# Patient Record
Sex: Male | Born: 1955 | Hispanic: Yes | Marital: Single | Smoking: Former smoker
Health system: Southern US, Community
[De-identification: ages and names within clinical notes are randomized; demographics above are authoritative.]

## PROBLEM LIST (undated history)

## (undated) DIAGNOSIS — K429 Umbilical hernia without obstruction or gangrene: Secondary | ICD-10-CM

## (undated) DIAGNOSIS — R32 Unspecified urinary incontinence: Secondary | ICD-10-CM

## (undated) DIAGNOSIS — F32A Depression, unspecified: Secondary | ICD-10-CM

## (undated) DIAGNOSIS — E785 Hyperlipidemia, unspecified: Secondary | ICD-10-CM

## (undated) DIAGNOSIS — I10 Essential (primary) hypertension: Secondary | ICD-10-CM

## (undated) DIAGNOSIS — S68012A Complete traumatic metacarpophalangeal amputation of left thumb, initial encounter: Secondary | ICD-10-CM

## (undated) DIAGNOSIS — F419 Anxiety disorder, unspecified: Secondary | ICD-10-CM

## (undated) DIAGNOSIS — N529 Male erectile dysfunction, unspecified: Secondary | ICD-10-CM

## (undated) DIAGNOSIS — F329 Major depressive disorder, single episode, unspecified: Secondary | ICD-10-CM

## (undated) DIAGNOSIS — E079 Disorder of thyroid, unspecified: Secondary | ICD-10-CM

## (undated) DIAGNOSIS — K4091 Unilateral inguinal hernia, without obstruction or gangrene, recurrent: Secondary | ICD-10-CM

## (undated) HISTORY — DX: Hyperlipidemia, unspecified: E78.5

## (undated) HISTORY — DX: Anxiety disorder, unspecified: F41.9

## (undated) HISTORY — DX: Complete traumatic metacarpophalangeal amputation of left thumb, initial encounter: S68.012A

## (undated) HISTORY — DX: Unilateral inguinal hernia, without obstruction or gangrene, recurrent: K40.91

## (undated) HISTORY — DX: Depression, unspecified: F32.A

## (undated) HISTORY — DX: Disorder of thyroid, unspecified: E07.9

## (undated) HISTORY — DX: Male erectile dysfunction, unspecified: N52.9

## (undated) HISTORY — DX: Major depressive disorder, single episode, unspecified: F32.9

## (undated) HISTORY — DX: Umbilical hernia without obstruction or gangrene: K42.9

## (undated) HISTORY — DX: Essential (primary) hypertension: I10

---

## 1988-10-10 HISTORY — PX: HERNIA REPAIR: SHX51

## 2011-02-22 ENCOUNTER — Inpatient Hospital Stay (HOSPITAL_COMMUNITY)
Admission: EM | Admit: 2011-02-22 | Discharge: 2011-02-25 | DRG: 645 | Disposition: A | Discharge status: Home / Self Care | Payer: Self-pay | Attending: Internal Medicine | Admitting: Internal Medicine

## 2011-02-22 ENCOUNTER — Emergency Department (HOSPITAL_COMMUNITY): Payer: Self-pay

## 2011-02-22 ENCOUNTER — Encounter (HOSPITAL_COMMUNITY): Payer: Self-pay

## 2011-02-22 DIAGNOSIS — R002 Palpitations: Secondary | ICD-10-CM | POA: Diagnosis present

## 2011-02-22 DIAGNOSIS — E0500 Thyrotoxicosis with diffuse goiter without thyrotoxic crisis or storm: Principal | ICD-10-CM | POA: Diagnosis present

## 2011-02-22 DIAGNOSIS — R5381 Other malaise: Secondary | ICD-10-CM | POA: Diagnosis present

## 2011-02-22 DIAGNOSIS — R7989 Other specified abnormal findings of blood chemistry: Secondary | ICD-10-CM | POA: Diagnosis present

## 2011-02-22 DIAGNOSIS — R634 Abnormal weight loss: Secondary | ICD-10-CM | POA: Diagnosis present

## 2011-02-22 DIAGNOSIS — E876 Hypokalemia: Secondary | ICD-10-CM | POA: Diagnosis present

## 2011-02-22 LAB — BASIC METABOLIC PANEL
BUN: 16 mg/dL (ref 6–23)
BUN: 14 mg/dL (ref 6–23)
Chloride: 107 mEq/L (ref 96–112)
Creatinine, Ser: <0.47 mg/dL (ref 0.4–1.5)
Creatinine, Ser: 0.5 mg/dL (ref 0.4–1.5)
GFR calc non Af Amer: >60 mL/min (ref >60)
Glucose, Bld: 131 mg/dL — ABNORMAL HIGH (ref 70–99)
Glucose, Bld: 182 mg/dL — ABNORMAL HIGH (ref 70–99)
Potassium: 3.4 mEq/L — ABNORMAL LOW (ref 3.5–5.1)

## 2011-02-22 LAB — COMPREHENSIVE METABOLIC PANEL
ALT: 22 U/L (ref 0–53)
AST: 16 U/L (ref 0–37)
Albumin: 3.7 g/dL (ref 3.5–5.2)
Alkaline Phosphatase: 123 U/L — ABNORMAL HIGH (ref 39–117)
Chloride: 104 mEq/L (ref 96–112)
Potassium: <2 mEq/L — CL (ref 3.5–5.1)
Sodium: 140 mEq/L (ref 135–145)
Total Bilirubin: 0.4 mg/dL (ref 0.3–1.2)
Total Protein: 6.9 g/dL (ref 6.0–8.3)

## 2011-02-22 LAB — MRSA PCR SCREENING: MRSA by PCR: NEGATIVE

## 2011-02-22 LAB — TSH: TSH: <0.008 u[IU]/mL — ABNORMAL LOW (ref 0.350–4.500)

## 2011-02-22 LAB — RAPID URINE DRUG SCREEN, HOSP PERFORMED
Amphetamines: NOT DETECTED
Benzodiazepines: NOT DETECTED
Cocaine: NOT DETECTED

## 2011-02-22 LAB — POTASSIUM: Potassium: 4 mEq/L (ref 3.5–5.1)

## 2011-02-23 LAB — BASIC METABOLIC PANEL
BUN: 10 mg/dL (ref 6–23)
CO2: 23 mEq/L (ref 19–32)
Calcium: 9.3 mg/dL (ref 8.4–10.5)
Chloride: 109 mEq/L (ref 96–112)
Creatinine, Ser: <0.47 mg/dL (ref 0.4–1.5)

## 2011-02-23 LAB — CARDIAC PANEL(CRET KIN+CKTOT+MB+TROPI)
CK, MB: 1.2 ng/mL (ref 0.3–4.0)
Relative Index: INVALID (ref 0.0–2.5)
Relative Index: INVALID (ref 0.0–2.5)
Troponin I: <0.3 ng/mL (ref <0.30)

## 2011-02-23 LAB — T4, FREE: Free T4: 6.1 ng/dL — ABNORMAL HIGH (ref 0.80–1.80)

## 2011-02-24 LAB — DIFFERENTIAL
Basophils Relative: 0 % (ref 0–1)
Eosinophils Relative: 2 % (ref 0–5)
Lymphocytes Relative: 29 % (ref 12–46)
Monocytes Absolute: 1.1 10*3/uL — ABNORMAL HIGH (ref 0.1–1.0)
Monocytes Relative: 14 % — ABNORMAL HIGH (ref 3–12)
Neutrophils Relative %: 55 % (ref 43–77)

## 2011-02-24 LAB — CBC
HCT: 39.5 % (ref 39.0–52.0)
MCV: 79.2 fL (ref 78.0–100.0)
RBC: 4.99 MIL/uL (ref 4.22–5.81)
RDW: 12.4 % (ref 11.5–15.5)
WBC: 7.5 10*3/uL (ref 4.0–10.5)

## 2011-02-24 LAB — T3, FREE: T3, Free: 15.8 pg/mL — ABNORMAL HIGH (ref 2.3–4.2)

## 2011-02-24 LAB — HEMOGLOBIN A1C
Hgb A1c MFr Bld: 5 % (ref <5.7)
Mean Plasma Glucose: 97 mg/dL (ref <117)

## 2011-02-24 LAB — NA AND K (SODIUM & POTASSIUM), RAND UR: Potassium Urine: 15 mEq/L

## 2011-02-24 LAB — BASIC METABOLIC PANEL
BUN: 13 mg/dL (ref 6–23)
CO2: 27 mEq/L (ref 19–32)
Chloride: 103 mEq/L (ref 96–112)
Creatinine, Ser: <0.47 mg/dL (ref 0.4–1.5)
Glucose, Bld: 99 mg/dL (ref 70–99)
Potassium: 4.3 mEq/L (ref 3.5–5.1)

## 2011-02-24 LAB — HEPATIC FUNCTION PANEL
Albumin: 3.6 g/dL (ref 3.5–5.2)
Indirect Bilirubin: 0.5 mg/dL (ref 0.3–0.9)
Total Protein: 6.3 g/dL (ref 6.0–8.3)

## 2011-02-24 LAB — TSH: TSH: <0.008 u[IU]/mL — ABNORMAL LOW (ref 0.350–4.500)

## 2011-02-24 LAB — CARDIAC PANEL(CRET KIN+CKTOT+MB+TROPI): Troponin I: <0.3 ng/mL (ref <0.30)

## 2011-02-24 LAB — T4, FREE: Free T4: 5.53 ng/dL — ABNORMAL HIGH (ref 0.80–1.80)

## 2011-02-25 LAB — CBC
HCT: 42 % (ref 39.0–52.0)
Hemoglobin: 15.4 g/dL (ref 13.0–17.0)
MCH: 29.2 pg (ref 26.0–34.0)
RBC: 5.28 MIL/uL (ref 4.22–5.81)

## 2011-02-25 LAB — RENAL FUNCTION PANEL
CO2: 26 mEq/L (ref 19–32)
Chloride: 100 mEq/L (ref 96–112)
Creatinine, Ser: <0.47 mg/dL (ref 0.4–1.5)
Glucose, Bld: 96 mg/dL (ref 70–99)
Sodium: 135 mEq/L (ref 135–145)

## 2011-02-25 LAB — BASIC METABOLIC PANEL
CO2: 26 mEq/L (ref 19–32)
Chloride: 100 mEq/L (ref 96–112)
Potassium: 4.2 mEq/L (ref 3.5–5.1)

## 2011-02-25 NOTE — Consult Note (Signed)
[name], [name]             ACCOUNT NO.:  [id number]  MEDICAL RECORD NO.:  [id number]           PATIENT TYPE:  I  LOCATION:  1442                         FACILITY:  [facility]  PHYSICIAN:  Tonita Cong, M.D.  DATE OF BIRTH:  03-14-1956  DATE OF CONSULTATION:  02/24/2011 DATE OF DISCHARGE:                                CONSULTATION   Consult requested by [facility].  REASON FOR CONSULTATION:  Hyperthyroidism.  HISTORY OF PRESENT ILLNESS:  [name] describes 10 months of unintentional weight loss.  He has lost from 190 pounds down to 150 pounds over those 10 months.  Associated tremors.  No clear precipitating or contributing factor.  On multiple occasions, he has experienced weakness in his legs upon awakening in the morning.  He describes hypokalemia found during an episode of lower extremity paralysis.  No previous diagnosis of a thyroid disorder.  He does not use thyroid hormone by prescription or supplement.  No recent iodinated contrast exposure.  PAST MEDICAL HISTORY: 1. Hypertension. 2. Hypokalemia.  ALLERGIES:  No known drug allergies.  FAMILY MEDICAL HISTORY:  No family history of thyroid disorders.  SOCIAL HISTORY:  Moved to [place] 5 days ago and has spent 3 days in the hospital.  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  No tobacco use.  MEDICATIONS:  Outpatient medications and inpatient medications reviewed including the pill bottles from the outpatient medicines and supplements.  REVIEW OF SYSTEMS:  No weight gain.  He describes palpitations with a rapid heartbeat at times.  He has intermittent diarrhea but no vomiting. No heat intolerance or cold intolerance.  No fever.  PHYSICAL EXAMINATION:  VITAL SIGNS:  Temperature 98.6, pulse 79, respirations 18, blood pressure 122/76, oxygen saturation 97% on room air. GENERAL:  No apparent distress.  Lying quietly  and comfortably in the hospital bed.  Spanish language interpreter present for the entire visit. HEENT:  Head, ears, nose and throat unremarkable. NECK:  Reveals mild symmetric enlargement of thyroid without audible bruit, nontender, mobile without fixation. LYMPHS:  No cervical adenopathy. CARDIOVASCULAR:  Regular rhythm and rate.  No audible murmur.  Radial pulses full and symmetric. SKIN:  Appropriate warmth.  No visible rash. NEUROLOGIC:  Deep tendon reflexes 1 to 2+ and symmetric.  Minimal tremor in hands.  No appreciable focal deficits. MUSCULOSKELETAL:  Normal muscle strength, no atrophy. GASTROINTESTINAL:  Active bowel sounds.  Normal pitched bowel sounds. Soft, nontender, nondistended abdomen.  No appreciable organomegaly or masses. RESPIRATORY:  Clear, symmetric, unlabored.  MENTAL STATUS EXAMINATION:  Alert, conversant.  No apparent hallucinations or delusions.  Insight and judgment appropriate. Appropriate affect and mood.  LABORATORY DATA:  From Feb 22, 2011; sodium 140, potassium less than 2.0, chloride 104, bicarbonate 22, BUN 16, creatinine less than 0.47, glucose 219, alkaline phosphatase 123, ALT 22, AST 16, calcium 9.1, albumin 3.7, magnesium 1.8.  On Feb 24, 2011, sodium 148, potassium 4.3, TSH less than 0.008 mU/mL, free T4 5.53 ng/dL with reference range 0.8 through 1.8, free T3 15.8 mcg/mL with reference range 2.3 through 4.2,  hemoglobin A1c 5.0%.  CT scan of head without contrast obtained upon admission without significant intracranial abnormality.  ASSESSMENT: 1. Hyperthyroidism. 2. Hypokalemia.  His duration of symptoms suggest either Graves' disease or toxic nodules. The exam favors Graves' disease.  A thyroiditis seems less likely since his symptoms have persisted more than 3 months.  The hypokalemia may be related to the hyperthyroidism since thyrotoxicosis increases sodium-potassium  ATPase activity which leads to increased intracellular potassium  concentrations.  This can cause what is known as thyrotoxic periodic paralysis.  It may happen particularly in a man who awakens in the morning with flaccid ascending paralysis as a result of untreated hyperthyroidism and typically there is history of vigorous exercise and/or large high carbohydrate meal before retiring.  In this gentleman's case, recommend starting antithyroid drug therapy with methimazole.  Also obtain CBC with differential to monitor blood counts before and during therapy.  Start beta-blocker therapy and may continue oral potassium supplement if needed.  Currently receiving intravenous normal saline without glucose.  No intravenous potassium at this time which is appropriate in this setting.  Recommend obtaining  thyroid stimulating immunoglobulin as an antibody test which is often positive in the setting of Graves' disease.  If the laboratory test does not reveal the underlying cause, then consider thyroid uptake and scan. Methimazole will need to be held for 7 days before the thyroid uptake scan, if needed.    Informed consent obtained after discussion of anticipated benefits, alternatives, nature of therapy, and risks including but not limited to rash, allergic reaction, bone marrow suppression, liver damage, etc. from methimazole.  Instructed the gentleman to stop taking methimazole and contact us if fever, rash, or sore throat develop.  I spoke with the hospitalist team.  Atenolol will be started as the beta-blocker therapy.  At this time recommend starting methimazole 60 mg by mouth daily.  Recommend a followup visit including a lab visit in 1 week and office visit in 3 to 4 weeks at [facility] with me.  We are located at [facility], [address], [address], [place], [place] [address], phone number [phone].  In one week, plan to obtain another CBC with differential and hepatic panel.  In 3 to 4 weeks at the  office visit we will check thyroid function test again.  Once the hyperthyroidism is improved, then consider more definitive therapy such as radioactive iodine or even thyroid surgery if indicated.  Education provided including a patient handout from the American [employer] Association on hyperthyroidism written in Spanish.     Tonita Cong, M.D.     JSK/MEDQ  D:  02/24/2011  T:  02/24/2011  Job:  [id number]  Electronically Signed by Talmage Coin M.D. on 02/25/2011 07:17:24 AM

## 2011-02-26 NOTE — H&P (Signed)
[name], VOLD             ACCOUNT NO.:  [id number]  MEDICAL RECORD NO.:  [id number]           PATIENT TYPE:  E  LOCATION:  WLED                         FACILITY:  [facility]  PHYSICIAN:  Houston Siren, MD           DATE OF BIRTH:  1956-01-28  DATE OF ADMISSION:  02/22/2011 DATE OF DISCHARGE:                             HISTORY & PHYSICAL   PRIMARY CARE PHYSICIAN:  None.  ADVANCE DIRECTIVE:  Full code.  REASON FOR ADMISSION:  Profound hypokalemia.  HISTORY OF PRESENT ILLNESS:  This is a 55 year old Hispanic male with a history of chronic hypokalemia and arthritis who presents to the emergency room feeling profoundly weak.  He stated his lower extremities were giving out on him. they were weak, that he actually fell on the ground. He also felt that his lower extremities were stiff.  There has been mild bilateral paresthesia and mild headache.  He denied any loose stool or diarrhea.  He has not been using any diuretic.  Reportedly, his friend gave him supplements of potassium, but it has not helped his symptoms. He stated he had hypokalemia before and was treated, but there is no old record from his E chart.  Emergency room evaluation showed that his potassium was below 2, and it was repeated and confirmed.  He does have normal serum sodium and normal creatinine.  His original EKG shows intraventricular conduction delay with U waves and deep S waves in the precordial leads.  He was originally given 40 mEq of potassium by mouth and one run of 10 mEq of potassium.  A repeat EKG showed sinus rhythm with no acute ST-T changes.  Hospitalist was asked to admit the patient because of profound hypokalemia and for further workup.  His symptoms actually improved after 1 run of potassium IV.  PAST MEDICAL HISTORY: 1. History of hypokalemia. 2. Arthritis.  SOCIAL HISTORY:  He denied tobacco, alcohol, or drug use.  FAMILY HISTORY:  Noncontributory.  MEDICATIONS:  Naproxen muscle relaxer  and potassium gluconate over-the- counter.  REVIEW OF SYSTEMS:  Otherwise unremarkable.  PHYSICAL EXAMINATION:  VITAL SIGNS:  Blood pressure 150/72, pulse of 108, respiratory rate of 20, temperature 98.0. HEENT:  His extraocular muscles are intact.  Sclerae are nonicteric.  He has no thyromegaly. CARDIAC:  Revealed S1 and S2 regular.  I did not hear any murmur, rub, or gallop. LUNGS:  Clear. ABDOMEN:  Soft, nondistended, nontender.  I did not hear any abdominal bruit. EXTREMITIES:  No edema.  Strength equal bilaterally.  Good distal pulses. SKIN:  Warm and dry.  OBJECTIVE FINDINGS:  EKG as noted above.  Serum sodium 140, potassium less than 2, bicarb of 22, creatinine of 0.47, BUN of 16.  Liver function tests are normal.  Alcohol level less than 11.  CT scan of the head is negative except for chronic sinusitis, magnesium of 1.8, calcium of 9.1.  Urine drug screen is negative.  IMPRESSION:  This is a 55 year old with profound hypokalemia with normal magnesium and normal calcium.  I am sure that his symptom is from this hypokalemia.  The differential here is great,  and that it would include adrenal producing tumor like an aldosterone secreting tumor, rule out hereditary hypokalemia from Bartter and Gitelman syndrome.  He could also have periodic hypokalemic paralysis, which can be dangerous. Thyroid problems can cause hypokalemia as well. He will need a complete workup, and nephrology consult would be very helpful.  For now, I would admit him to the step-down and replete his  potassium as his total body potassium deficit is very large.  I am glad that his serum calcium is normal, but we will need to follow that closely as well.  He is a full code, will be admitted to [facility] Citizen, MD     PL/MEDQ  D:  02/22/2011  T:  02/22/2011  Job:  [id number]  Electronically Signed by Houston Siren  on 02/26/2011 11:31:32 PM

## 2011-02-28 LAB — THYROID STIMULATING IMMUNOGLOBULIN: TSI: 336 % baseline — ABNORMAL HIGH (ref <140)

## 2011-03-03 LAB — ALDOSTERONE + RENIN ACTIVITY W/ RATIO
ALDO / PRA Ratio: 3.7 Ratio (ref 0.9–28.9)
Aldosterone: 9 ng/dL

## 2011-03-10 NOTE — Consult Note (Signed)
[name], Atkins             ACCOUNT NO.:  [id number]  MEDICAL RECORD NO.:  [id number]           PATIENT TYPE:  I  LOCATION:  1442                         FACILITY:  [facility]  PHYSICIAN:  Terrial Rhodes, M.D.DATE OF BIRTH:  [birth year]  DATE OF CONSULTATION: DATE OF DISCHARGE:                                CONSULTATION   CONSULTING PHYSICIAN:  Marcellus Scott, MD  REASON FOR CONSULTATION:  Recurrent hypokalemia with weakness.  HISTORY OF PRESENT ILLNESS:  [name] is a 55 year old Hispanic male with a past medical history significant for arthritis and recurrent hypokalemia with weakness.  He presented to the emergency department of [facility] with feeling profoundly weak as well as cramping and reported that he felt like this in the past and was seen at another hospital about a year ago with similar symptoms and was found to have low potassium.  While in the emergency department, his serum potassium was less than 2.  He was admitted for IV potassium replacement.  His EKG showed hypokalemic changes with U waves, and he has continued to feel better since his admission.  However, due to recurring nature of this problem, we were consulted to further evaluate and manage.  ALLERGIES:  He has no known drug allergies.  PAST MEDICAL HISTORY: 1. History of hypertension 2 years ago, was on a blood pressure     medicine but has not been taking anything. 2. History of recurrent hypokalemia with weakness. 3. Arthritis.  OUTPATIENT MEDICATIONS:  He was taking over-the-counter potassium, Naprosyn and a muscle relaxer.  FAMILY HISTORY:  Noncontributory.  No one else in his family with this problem.  SOCIAL HISTORY:  Denied tobacco, alcohol or drug use.  REVIEW OF SYSTEMS:  GENERAL:  Denies any anorexia or malaise but just had the weakness prior to admission.  CARDIAC:  He reported some palpitations and chest pain prior to admission.  HEENT:  No headaches, tinnitus,  dysphagia, odynophagia.  PULMONARY:  No shortness of breath, hemoptysis, productive cough.  He did have 1 episode of dyspnea on exertion.  GI:  No nausea or vomiting but did have a bout of diarrhea about a month ago that lasted for 2 weeks.  He reports some abdominal bloating occasionally but no anorexia or weight loss.  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  No polydipsia, polyphagia, polyuria.  RHEUMATOLOGIC:  He has chronic arthralgias and myalgias as well as the cramping that was described above.  NEUROLOGIC:  Had generalized weakness.  No numbness, tingling, ataxia, aphasia.  All other systems negative.  PHYSICAL EXAMINATION:  GENERAL:  A well-developed, well-nourished man in no apparent distress, lying in bed. Temperature is 98, pulse is 74, blood pressure 112/72, respiratory rate is 20. HEENT:  No proptosis.  Oropharynx without lesions. NECK:  Supple.  No lymphadenopathy or bruits.  No thyromegaly. LUNGS:  Clear to auscultation and percussion bilaterally.  No rales or rhonchi. CARDIAC:  Regular rate and rhythm.  No precordial rub appreciated. ABDOMEN:  Normoactive bowel sounds.  Soft, nontender, nondistended.  No guarding, rebound or bruits. EXTREMITIES:  No clubbing, cyanosis or edema. NEUROLOGIC:  Grossly intact.  No hyperreflexia.  Negative Chvostek sign.  LABORATORY DATA:  Sodium 140, potassium 4.3, chloride 103, CO2 27, BUN 13, creatinine 0.47, glucose 99, calcium 10.3.  TSH is less than 0.008. T3 is 15.8.  T4 is 5.53.  ASSESSMENT AND PLAN: 1. Recurrent hypokalemia with weakness.  The patient was hypertensive     on admission but since admission his blood pressures have been in     the 110s.  He also had a metabolic acidosis on admission, so     Bartter's and Gitelman's are not likely.  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  Also in the differential would be hyperaldosteronism     given his hypertension but again he has been normotensive since     admission.  He had a low urine potassium with metabolic acidosis     that would suggest GI losses.  He did have some diarrhea prior to     admission and this has since resolved, so this is also in the     differential, but we will continue to follow for now.  We will also     decrease his potassium to 40 mEq a day.  His potassium has been     climbing and has been maintaining within normal limits. 2. Hyperthyroidism.  We will need workup and possibly propranolol.     Agree with Endocrine consult. 3. Chest pain and palpitations could possibly be due to thyrotoxicosis     but also coronary artery disease workup per primary service. 4. Hypertension.  He has a history of hypertension in the past but has     not been taking medicines.  He has been normotensive since he has     been hospitalized with occasional fluctuations in the 130s.  We     will continue to follow for now but blood pressure medicine of     shortness in this instance would be propranolol.  Again, we will     wait for Endocrine to further evaluate the patient, and we will     continue to follow.  We will order Renin aldosterone levels in the     morning.  Thank you for this consultation.          ______________________________ Terrial Rhodes, M.D.     JC/MEDQ  D:  02/24/2011  T:  02/24/2011  Job:  [id number]  Electronically Signed by Terrial Rhodes M.D. on 03/10/2011 07:43:40 AM

## 2011-03-17 ENCOUNTER — Emergency Department (HOSPITAL_COMMUNITY)
Admission: EM | Admit: 2011-03-17 | Discharge: 2011-03-18 | Disposition: A | Discharge status: Home / Self Care | Payer: Self-pay | Attending: Emergency Medicine | Admitting: Emergency Medicine

## 2011-03-17 ENCOUNTER — Emergency Department (HOSPITAL_COMMUNITY)
Admission: EM | Admit: 2011-03-17 | Discharge: 2011-03-17 | Discharge status: Against Medical Advice | Payer: Self-pay | Attending: Emergency Medicine | Admitting: Emergency Medicine

## 2011-03-17 DIAGNOSIS — Z760 Encounter for issue of repeat prescription: Secondary | ICD-10-CM | POA: Insufficient documentation

## 2011-03-17 DIAGNOSIS — E876 Hypokalemia: Secondary | ICD-10-CM | POA: Insufficient documentation

## 2011-03-18 LAB — POCT I-STAT, CHEM 8
HCT: 42 % (ref 39.0–52.0)
Hemoglobin: 14.3 g/dL (ref 13.0–17.0)
Sodium: 140 mEq/L (ref 135–145)
TCO2: 23 mmol/L (ref 0–100)

## 2011-04-01 NOTE — Discharge Summary (Signed)
[name], [name]             ACCOUNT NO.:  [id number]  MEDICAL RECORD NO.:  [id number]           PATIENT TYPE:  I  LOCATION:  1442                         FACILITY:  [facility]  PHYSICIAN:  Marcellus Scott, MD     DATE OF BIRTH:  Jun 08, 1956  DATE OF ADMISSION:  02/22/2011 DATE OF DISCHARGE:  02/25/2011                              DISCHARGE SUMMARY   PRIMARY CARE PHYSICIAN:  Mr. Hardie Shackleton primary care physician is Dr. Lerry Liner.  ENDOCRINOLOGIST:  Tonita Cong, M.D with [facility] endocrinology.  DISCHARGE DIAGNOSES: 1. Hyperthyroidism, possibly secondary to Graves' disease. 2. Hypokalemia secondary to hyperthyroidism. 3. Weight loss secondary to hyperthyroidism. 4. Palpitations secondary to hyperthyroidism. 5. Elevated LFTs secondary to hyperthyroidism.  CONDITION ON DISCHARGE:  [name] is alert and oriented.  He appears energetic, feels well, complains of a mild sore throat and mild lower abdominal pain, but is ready for discharge in good condition.  HISTORY AND BRIEF HOSPITAL COURSE:  [name] is an extremely pleasant 55 year old Hispanic gentleman, who speaks very little [place]. He presented to the Emergency Department feeling profoundly weak.  His lower extremities were giving out on him and he had actually fallen to the ground.  He felt as if his lower extremities were stiff.  There had been mild bilateral paresthesia and mild headache.  In the Emergency Department, he was found to have hypokalemia.  The patient reported that this had happened to him in the past and that over the past 10 months, he had lost approximately 40 pounds.  His original EKG showed intraventricular conduction delay with U waves deep S wave in the precordial lead.  A repeat EKG showed sinus rhythm with no acute ST changes.  He was admitted for profound hypokalemia.  On Feb 24, 2011, he was seen in consultation by Dr. Terrial Rhodes with the renal service, who felt that the  patient likely has hyperthyroidism and would need an endocrine consult.  On the same date, Feb 24, 2011, he was seen by Dr. Talmage Coin of endocrinology.  Dr. Daune Perch impression was: 1. Hyperthyroidism. 2. Hypokalemia.  Dr. Sharl Ma notes that the duration of the patient's symptoms suggested a Graves disease or toxic nodule.  Exam favors Graves disease. Hypokalemia may be related to hyperthyroidism and can cause what is known as thyrotoxic periodic paralysis.  Dr. Sharl Ma recommended starting antithyroid drugs therapy with methimazole as well as beta-blocker therapy and continuation of oral potassium supplement.  Dr. Sharl Ma notes that the informed consent was obtained after discussion of anticipated benefits and alternatives and the nature of the therapy of the methimazole.  The patient was instructed to stop taking methimazole if he develops fever, rash or sore throat  Dr. Sharl Ma recommended followup visits including a lab visit on Mar 03, 2011 and March 18, 2011 and an office visit in 3-4 weeks at [facility].  Labs visits were to draw CBC, BMET and hepatic panel.  PHYSICAL EXAMINATION:  GENERAL:  Today, Feb 25, 2011, the patient is alert and oriented and appears ready for discharge. VITAL SIGNS:  Were as follows:  Temperature 97.5, pulse 59, respirations 18, blood pressure 118/73,  O2 saturation is 99% on room air. HEAD:  Atraumatic, normocephalic. EYES:  Anicteric with pupils are equal, round and reactive to light. NOSE:  Shows no nasal discharge or exterior lesions. MOUTH:  Has moist mucous membranes with good dentition. NECK:  Supple with midline trachea.  No JVD.  No lymphadenopathy.  He does have mild bilateral thyromegaly. CHEST:  Clear to auscultation bilaterally without wheezes or crackles. HEART:  Has regular rate and rhythm without obvious murmurs, rubs or gallops. ABDOMEN:  Soft, nontender, nondistended with good bowel sounds. EXTREMITIES:  Show no clubbing, cyanosis or  edema. NEUROLOGIC:  The patient is alert and oriented, walking around the room. He appears healthy.  CONSULTATIONS: 1. Terrial Rhodes, M.D. saw the patient on Feb 24, 2011. 2. Tonita Cong, M.D. saw the patient Feb 24, 2011.  PERTINENT LABORATORY DATA:  On admission, the patient was found to have a TSH of 0.008.  On Feb 24, 2011, he had a free T4 of 5.53.  TSH 0.008 and free T3 15.8.  Urine creatinine 14.2.  Urine sodium 122, urine potassium 15.  His potassium, which was less than two on admission was repleted.  He currently has a potassium on discharge of 4.2, sodium 134, chloride 100, bicarb 26, glucose 92, BUN 16, creatinine 0.47, serum albumin 3.7, serum calcium 10.3.  Serum phosphorus 4.9.  Hemoglobin 15.1, white count 8.6, hematocrit 42.0, platelets 198,000.  SIGNIFICANT IMAGING:  The patient had a CT of his head without contrast on Feb 22, 2011 that showed no significant abnormality of the brain.  He does have chronic sinusitis.  Patient's EKG report from Feb 24, 2011 shows normal sinus rhythm, right atrial enlargement.  His telemetry today has been reading normal sinus rhythm with pulses in the 60s to80s.  DISCHARGE MEDICATIONS:  Are as follows: 1. 81 mg aspirin 1 tablet daily. 2. Atenolol 25 mg 1 tablet daily. 3. Methimazole 10 mg 6 tablets daily by mouth. 4. Potassium chloride 40 mEq 1 tablet by mouth daily until seen by Dr.     Sharl Ma. 5. Multivitamin 1 tablet by mouth daily.  Stop taking naproxen, cyclobenzaprine, potassium gluconate and Advil.  DISCHARGE INSTRUCTIONS:  The patient is to increase his activity slowly. He is to return to work Monday Feb 28, 2011.  Stop any activity that causes chest pain, shortness of breath, dizziness, sweating or excessive weakness.  If his sore throat worsens, he is to call Dr. Daune Perch office at [phone] to see if he should stop taking methimazole.  He will be on a regular diet and avoid caffeine.  FOLLOW-UP APPOINTMENTS:   Include a lab visit with Dr. Sharl Ma on Mar 03, 2011 at 10:00 a.m., a second lab visit with Dr. Sharl Ma on March 18, 2011 at 10:00 a.m.  He is to have an office visit with Dr. Sharl Ma on April 04, 2009 at 12:30 p.m. and an office visit with his PCP, Dr. Lerry Liner in 1- 2 weeks.     Stephani Police, PA   ______________________________ Marcellus Scott, MD    MLY/MEDQ  D:  02/25/2011  T:  02/25/2011  Job:  [id number]  cc:   Tonita Cong, M.D. Fax: [phone]  [place]ight Dr. Mayford Knife  Electronically Signed by Algis Downs PA on 03/08/2011 08:09:37 PM Electronically Signed by Marcellus Scott MD on 04/01/2011 10:23:12 PM

## 2012-02-22 ENCOUNTER — Encounter (INDEPENDENT_AMBULATORY_CARE_PROVIDER_SITE_OTHER): Payer: Self-pay

## 2012-07-31 ENCOUNTER — Encounter (INDEPENDENT_AMBULATORY_CARE_PROVIDER_SITE_OTHER): Payer: Self-pay | Admitting: General Surgery

## 2012-08-03 ENCOUNTER — Encounter (INDEPENDENT_AMBULATORY_CARE_PROVIDER_SITE_OTHER): Payer: Self-pay | Admitting: General Surgery

## 2012-08-03 ENCOUNTER — Ambulatory Visit (INDEPENDENT_AMBULATORY_CARE_PROVIDER_SITE_OTHER): Payer: Self-pay | Admitting: General Surgery

## 2012-08-03 VITALS — BP 126/80 | HR 75 | Temp 98.0°F | Resp 18 | Ht 65.0 in | Wt 187.0 lb

## 2012-08-03 DIAGNOSIS — K4090 Unilateral inguinal hernia, without obstruction or gangrene, not specified as recurrent: Secondary | ICD-10-CM

## 2012-08-03 NOTE — Progress Notes (Signed)
Patient ID: [name], male   DOB: 04-Apr-1956, 56 y.o.   MRN: [id number]  Chief Complaint  Patient presents with  . New Evaluation    L/H    HPI [name] is a 56 y.o. male referred by Dr. Mayford Knife for a left inguinal hernia.  The patient is a 33 Spanish-speaking only with a continued history of elective or hernia. Patient states that the hernia is now becoming more and more painful it has grown slightly. Patient states at this time secondary to pain like to have this electively fixed. He states he is able to reduce the hernia  while at home,and has had no signs of incarceration or needed to proceed daily for any other problems. Patient does say he doesn't at times have pain in his chest has not seen a cardiologist because of this. Patient does have a history of right ingrown hernia repair in the past. HPI  Past Medical History  Diagnosis Date  . Depression   . Thyroid disease   . ED (erectile dysfunction)   . Hypertension     No past surgical history on file.  No family history on file.  Social History History  Substance Use Topics  . Smoking status: Never Smoker   . Smokeless tobacco: Not on file  . Alcohol Use: Not on file    No Known Allergies  Current Outpatient Prescriptions  Medication Sig Dispense Refill  . Ciprofloxacin (CIPRO PO) Take by mouth.      . fluorescein-benoxinate (FLURATE) ophthalmic solution 1 drop once.      . potassium chloride SA (K-DUR,KLOR-CON) 20 MEQ tablet Take 20 mEq by mouth 2 (two) times daily.      Marland Kitchen aspirin 81 MG tablet Take 81 mg by mouth daily.        Review of Systems Review of Systems  Constitutional: Negative.   HENT: Negative.   Cardiovascular: Negative.   Gastrointestinal: Negative.   Neurological: Negative.     Blood pressure 126/80, pulse 75, temperature 98 F (36.7 C), temperature source Oral, resp. rate 18, height 5\' 5"  (1.651 m), weight 187 lb (84.823 kg).  Physical Exam Physical Exam  Constitutional: He  is oriented to person, place, and time. He appears well-developed and well-nourished.  HENT:  Head: Normocephalic and atraumatic.  Eyes: Conjunctivae normal and EOM are normal. Pupils are equal, round, and reactive to light.  Neck: Normal range of motion.  Cardiovascular: Normal rate, regular rhythm and normal heart sounds.   Pulmonary/Chest: Effort normal and breath sounds normal.  Abdominal: Soft. Bowel sounds are normal. A hernia is present. Hernia confirmed positive in the left inguinal area (reducible).  Musculoskeletal: Normal range of motion.  Neurological: He is alert and oriented to person, place, and time.    Data Reviewed none  Assessment    The patient is a 56 year old male with a left reducible inguinal hernia moderate-sized. The patient does have a history of having chest pain and refer him for cardiac evaluation prior to surgery.    Plan    1. Referred for cardiac evaluation prior to scheduling his left inguinal hernia repair. 2  After cardiac evaluation patient will be scheduled for laparoscopic left inguinal hernia repair. 3. All risks and benefits were discussed with the patient, to generally include infection, bleeding, damage to surrounding structures, and recurrence. Alternatives were offered and described.  All questions were answered and the patient voiced understanding of the procedure and wishes to proceed at this point.  Marigene Ehlers., Happy Begeman 08/03/2012, 1:49 PM

## 2012-08-06 ENCOUNTER — Ambulatory Visit (INDEPENDENT_AMBULATORY_CARE_PROVIDER_SITE_OTHER): Payer: Self-pay | Admitting: Cardiology

## 2012-08-06 ENCOUNTER — Encounter: Payer: Self-pay | Admitting: Cardiology

## 2012-08-06 VITALS — BP 131/79 | HR 73 | Wt 186.0 lb

## 2012-08-06 DIAGNOSIS — Z01810 Encounter for preprocedural cardiovascular examination: Secondary | ICD-10-CM | POA: Insufficient documentation

## 2012-08-06 DIAGNOSIS — R079 Chest pain, unspecified: Secondary | ICD-10-CM | POA: Insufficient documentation

## 2012-08-06 NOTE — Patient Instructions (Addendum)
Your physician recommends that you schedule a follow-up appointment in: AS NEEDED PENDING TEST RESULTS  Your physician has requested that you have a stress echocardiogram. For further information please visit [email or web address]. Please follow instruction sheet as given.    

## 2012-08-06 NOTE — Assessment & Plan Note (Signed)
Symptoms atypical. Plan stress echocardiogram. 

## 2012-08-06 NOTE — Assessment & Plan Note (Signed)
Plan stress echocardiogram. If normal patient may proceed with surgery.

## 2012-08-06 NOTE — Progress Notes (Signed)
  HPI: 56 year old male with no prior cardiac history for evaluation of chest pain him preoperatively prior to hernia repair. Patient has no prior cardiac history. Note he does not speak [place]. History is obtained with the help of an interpreter. The patient does have dyspnea on exertion but no orthopnea, PND, pedal edema, palpitations or syncope. He does occasionally have chest pain. The pain is in the left chest area and is associated with stress. It is described as a burning pain with no associated symptoms. He has had this for years. He does not have exertional chest pain. He is scheduled to have hernia repair and cardiology was asked to evaluate preoperatively.  No current outpatient prescriptions on file.    No Known Allergies  Past Medical History  Diagnosis Date  . Depression   . Thyroid disease   . ED (erectile dysfunction)   . Hypertension   . Hyperlipidemia     Past Surgical History  Procedure Date  . Hernia repair     History   Social History  . Marital Status: Legally Separated    Spouse Name: N/A    Number of Children: 5  . Years of Education: N/A   Occupational History  .      [employer]   Social History Main Topics  . Smoking status: Former Games developer  . Smokeless tobacco: Not on file  . Alcohol Use: No  . Drug Use: Not on file  . Sexually Active: Not on file   Other Topics Concern  . Not on file   Social History Narrative  . No narrative on file    Family History  Problem Relation Age of Onset  . Heart disease      No family history     ROS: no fevers or chills, productive cough, hemoptysis, dysphasia, odynophagia, melena, hematochezia, dysuria, hematuria, rash, seizure activity, orthopnea, PND, pedal edema, claudication. Remaining systems are negative.  Physical Exam:   Blood pressure 131/79, pulse 73, weight 186 lb (84.369 kg).  General:  Well developed/well nourished in NAD Skin warm/dry Patient not depressed No peripheral  clubbing Back-normal HEENT-normal/normal eyelids Neck supple/normal carotid upstroke bilaterally; no bruits; no JVD; no thyromegaly chest - CTA/ normal expansion CV - RRR/normal S1 and S2; no murmurs, rubs or gallops;  PMI nondisplaced Abdomen -NT/ND, no HSM, no mass, + bowel sounds, no bruit 2+ femoral pulses, no bruits Ext-no edema, chords, 2+ DP Neuro-grossly nonfocal  ECG sinus rhythm at a rate of 73. Incomplete right bundle branch block. Nonspecific ST changes.

## 2012-08-17 ENCOUNTER — Ambulatory Visit (HOSPITAL_COMMUNITY): Payer: Self-pay | Attending: Cardiology

## 2012-08-17 ENCOUNTER — Ambulatory Visit (HOSPITAL_COMMUNITY): Payer: Self-pay | Attending: Cardiology | Admitting: Radiology

## 2012-08-17 ENCOUNTER — Other Ambulatory Visit (HOSPITAL_COMMUNITY): Payer: Self-pay

## 2012-08-17 ENCOUNTER — Encounter: Payer: Self-pay | Admitting: Cardiology

## 2012-08-17 DIAGNOSIS — Z01810 Encounter for preprocedural cardiovascular examination: Secondary | ICD-10-CM | POA: Insufficient documentation

## 2012-08-17 DIAGNOSIS — R079 Chest pain, unspecified: Secondary | ICD-10-CM

## 2012-08-17 DIAGNOSIS — R072 Precordial pain: Secondary | ICD-10-CM | POA: Insufficient documentation

## 2012-08-17 DIAGNOSIS — R0989 Other specified symptoms and signs involving the circulatory and respiratory systems: Secondary | ICD-10-CM

## 2012-08-17 NOTE — Progress Notes (Signed)
Echocardiogram performed.  

## 2012-09-11 ENCOUNTER — Other Ambulatory Visit (INDEPENDENT_AMBULATORY_CARE_PROVIDER_SITE_OTHER): Payer: Self-pay | Admitting: General Surgery

## 2012-10-19 ENCOUNTER — Encounter (HOSPITAL_COMMUNITY): Payer: Self-pay

## 2012-10-19 ENCOUNTER — Encounter (HOSPITAL_COMMUNITY)
Admission: RE | Admit: 2012-10-19 | Discharge: 2012-10-19 | Disposition: A | Discharge status: Home / Self Care | Payer: Self-pay | Source: Ambulatory Visit | Attending: General Surgery | Admitting: General Surgery

## 2012-10-19 ENCOUNTER — Encounter (HOSPITAL_COMMUNITY)
Admission: RE | Admit: 2012-10-19 | Discharge: 2012-10-19 | Disposition: A | Discharge status: Home / Self Care | Payer: Self-pay | Source: Ambulatory Visit | Attending: Anesthesiology | Admitting: Anesthesiology

## 2012-10-19 LAB — BASIC METABOLIC PANEL
BUN: 15 mg/dL (ref 6–23)
GFR calc Af Amer: >90 mL/min (ref >90)
GFR calc non Af Amer: >90 mL/min (ref >90)
Potassium: 4.5 mEq/L (ref 3.5–5.1)
Sodium: 140 mEq/L (ref 135–145)

## 2012-10-19 LAB — CBC
Hemoglobin: 16.6 g/dL (ref 13.0–17.0)
MCHC: 35.2 g/dL (ref 30.0–36.0)
WBC: 9.5 10*3/uL (ref 4.0–10.5)

## 2012-10-19 LAB — SURGICAL PCR SCREEN
MRSA, PCR: NEGATIVE
Staphylococcus aureus: NEGATIVE

## 2012-10-19 NOTE — Pre-Procedure Instructions (Signed)
Brylee Mcgreal  10/19/2012   Your procedure is scheduled on:  January 14  Report to [facility] at 07:15 AM.  Call this number if you have problems the morning of surgery: [phone]   Remember:   Do not eat food or drink liquids after midnight.   Take these medicines the morning of surgery with A SIP OF WATER: None   Do not wear jewelry, make-up or nail polish.  Do not wear lotions, powders, or perfumes. You may wear deodorant.  Do not shave 48 hours prior to surgery. Men may shave face and neck.  Do not bring valuables to the hospital.  Contacts, dentures or bridgework may not be worn into surgery.  Leave suitcase in the car. After surgery it may be brought to your room.  For patients admitted to the hospital, checkout time is 11:00 AM the day of  discharge.   Patients discharged the day of surgery will not be allowed to drive  home.  Name and phone number of your driver: In chart  Special Instructions: Shower using CHG 2 nights before surgery and the night before surgery.  If you shower the day of surgery use CHG.  Use special wash - you have one bottle of CHG for all showers.  You should use approximately 1/3 of the bottle for each shower.   Please read over the following fact sheets that you were given: Pain Booklet, Coughing and Deep Breathing and Surgical Site Infection Prevention

## 2012-10-22 MED ORDER — CEFAZOLIN SODIUM-DEXTROSE 2-3 GM-% IV SOLR: 2.0000 g | INTRAVENOUS | Status: DC

## 2012-10-22 MED ORDER — CEFAZOLIN SODIUM-DEXTROSE 2-3 GM-% IV SOLR
2.0000 g | INTRAVENOUS | Status: AC
  Administered 2012-10-23: 2 g via INTRAVENOUS
  Filled 2012-10-22: qty 50

## 2012-10-23 ENCOUNTER — Encounter (HOSPITAL_COMMUNITY): Payer: Self-pay | Admitting: Anesthesiology

## 2012-10-23 ENCOUNTER — Encounter (HOSPITAL_COMMUNITY)
Admission: RE | Disposition: A | Discharge status: Home / Self Care | Payer: Self-pay | Source: Ambulatory Visit | Attending: General Surgery

## 2012-10-23 ENCOUNTER — Ambulatory Visit (HOSPITAL_COMMUNITY)
Admission: RE | Admit: 2012-10-23 | Discharge: 2012-10-23 | Disposition: A | Discharge status: Home / Self Care | Payer: Self-pay | Source: Ambulatory Visit | Attending: General Surgery | Admitting: General Surgery

## 2012-10-23 ENCOUNTER — Ambulatory Visit (HOSPITAL_COMMUNITY): Payer: Self-pay | Admitting: Anesthesiology

## 2012-10-23 ENCOUNTER — Encounter (HOSPITAL_COMMUNITY): Payer: Self-pay | Admitting: *Deleted

## 2012-10-23 DIAGNOSIS — K4090 Unilateral inguinal hernia, without obstruction or gangrene, not specified as recurrent: Secondary | ICD-10-CM

## 2012-10-23 DIAGNOSIS — I10 Essential (primary) hypertension: Secondary | ICD-10-CM | POA: Insufficient documentation

## 2012-10-23 DIAGNOSIS — Z01812 Encounter for preprocedural laboratory examination: Secondary | ICD-10-CM | POA: Insufficient documentation

## 2012-10-23 DIAGNOSIS — Z7901 Long term (current) use of anticoagulants: Secondary | ICD-10-CM | POA: Insufficient documentation

## 2012-10-23 HISTORY — PX: INGUINAL HERNIA REPAIR: SHX194

## 2012-10-23 HISTORY — PX: INSERTION OF MESH: SHX5868

## 2012-10-23 SURGERY — REPAIR, HERNIA, INGUINAL, LAPAROSCOPIC
Anesthesia: General | Site: Abdomen | Laterality: Left | Wound class: Clean

## 2012-10-23 MED ORDER — MIDAZOLAM HCL 5 MG/5ML IJ SOLN
INTRAMUSCULAR | Status: DC | PRN
  Administered 2012-10-23: 2 mg via INTRAVENOUS

## 2012-10-23 MED ORDER — LACTATED RINGERS IV SOLN
INTRAVENOUS | Status: DC | PRN
  Administered 2012-10-23 (×2): via INTRAVENOUS

## 2012-10-23 MED ORDER — CHLORHEXIDINE GLUCONATE 4 % EX LIQD: 1.0000 "application " | Freq: Once | CUTANEOUS | Status: DC

## 2012-10-23 MED ORDER — HYDROMORPHONE HCL PF 1 MG/ML IJ SOLN
INTRAMUSCULAR | Status: AC
  Administered 2012-10-23: 0.25 mg via INTRAVENOUS
  Filled 2012-10-23: qty 1

## 2012-10-23 MED ORDER — BUPIVACAINE HCL 0.25 % IJ SOLN
INTRAMUSCULAR | Status: DC | PRN
  Administered 2012-10-23: 8 mL

## 2012-10-23 MED ORDER — HYDROMORPHONE HCL PF 1 MG/ML IJ SOLN
0.2500 mg | INTRAMUSCULAR | Status: DC | PRN
  Administered 2012-10-23: 0.25 mg via INTRAVENOUS

## 2012-10-23 MED ORDER — ROCURONIUM BROMIDE 100 MG/10ML IV SOLN
INTRAVENOUS | Status: DC | PRN
  Administered 2012-10-23: 10 mg via INTRAVENOUS
  Administered 2012-10-23: 40 mg via INTRAVENOUS

## 2012-10-23 MED ORDER — GLYCOPYRROLATE 0.2 MG/ML IJ SOLN
INTRAMUSCULAR | Status: DC | PRN
  Administered 2012-10-23: 0.4 mg via INTRAVENOUS
  Administered 2012-10-23: 0.6 mg via INTRAVENOUS

## 2012-10-23 MED ORDER — CEFAZOLIN SODIUM 1-5 GM-% IV SOLN
INTRAVENOUS | Status: AC
  Filled 2012-10-23: qty 50

## 2012-10-23 MED ORDER — BUPIVACAINE HCL (PF) 0.25 % IJ SOLN
INTRAMUSCULAR | Status: AC
  Filled 2012-10-23: qty 30

## 2012-10-23 MED ORDER — LACTATED RINGERS IV SOLN
INTRAVENOUS | Status: DC
  Administered 2012-10-23: 09:00:00 via INTRAVENOUS

## 2012-10-23 MED ORDER — FENTANYL CITRATE 0.05 MG/ML IJ SOLN
INTRAMUSCULAR | Status: DC | PRN
  Administered 2012-10-23 (×3): 50 ug via INTRAVENOUS
  Administered 2012-10-23: 100 ug via INTRAVENOUS

## 2012-10-23 MED ORDER — PROPOFOL 10 MG/ML IV BOLUS
INTRAVENOUS | Status: DC | PRN
  Administered 2012-10-23: 40 mg via INTRAVENOUS
  Administered 2012-10-23: 160 mg via INTRAVENOUS

## 2012-10-23 MED ORDER — OXYCODONE-ACETAMINOPHEN 10-325 MG PO TABS: 1.0000 | ORAL_TABLET | ORAL | Status: DC | PRN

## 2012-10-23 MED ORDER — NEOSTIGMINE METHYLSULFATE 1 MG/ML IJ SOLN
INTRAMUSCULAR | Status: DC | PRN
  Administered 2012-10-23: 4 mg via INTRAVENOUS

## 2012-10-23 MED ORDER — LIDOCAINE HCL (CARDIAC) 20 MG/ML IV SOLN
INTRAVENOUS | Status: DC | PRN
  Administered 2012-10-23: 70 mg via INTRAVENOUS

## 2012-10-23 MED ORDER — SODIUM CHLORIDE 0.9 % IR SOLN
Status: DC | PRN
  Administered 2012-10-23: 1000 mL

## 2012-10-23 MED ORDER — ONDANSETRON HCL 4 MG/2ML IJ SOLN
INTRAMUSCULAR | Status: DC | PRN
  Administered 2012-10-23: 4 mg via INTRAVENOUS

## 2012-10-23 MED ORDER — EPHEDRINE SULFATE 50 MG/ML IJ SOLN
INTRAMUSCULAR | Status: DC | PRN
  Administered 2012-10-23: 10 mg via INTRAVENOUS

## 2012-10-23 SURGICAL SUPPLY — 40 items
APPLIER CLIP LOGIC TI 5 (MISCELLANEOUS) IMPLANT
BENZOIN TINCTURE PRP APPL 2/3 (GAUZE/BANDAGES/DRESSINGS) ×2 IMPLANT
CANISTER SUCTION 2500CC (MISCELLANEOUS) IMPLANT
CHLORAPREP W/TINT 26ML (MISCELLANEOUS) ×2 IMPLANT
CLOTH BEACON ORANGE TIMEOUT ST (SAFETY) ×2 IMPLANT
COVER SURGICAL LIGHT HANDLE (MISCELLANEOUS) ×2 IMPLANT
DEVICE TROCAR PUNCTURE CLOSURE (ENDOMECHANICALS) ×2 IMPLANT
DISSECTOR BLUNT TIP ENDO 5MM (MISCELLANEOUS) IMPLANT
DRAPE UTILITY 15X26 W/TAPE STR (DRAPE) ×4 IMPLANT
ELECT REM PT RETURN 9FT ADLT (ELECTROSURGICAL) ×2
ELECTRODE REM PT RTRN 9FT ADLT (ELECTROSURGICAL) ×1 IMPLANT
GAUZE SPONGE 2X2 8PLY STRL LF (GAUZE/BANDAGES/DRESSINGS) ×1 IMPLANT
GLOVE BIO SURGEON STRL SZ7.5 (GLOVE) ×4 IMPLANT
GLOVE BIOGEL PI IND STRL 7.5 (GLOVE) ×3 IMPLANT
GLOVE BIOGEL PI INDICATOR 7.5 (GLOVE) ×3
GOWN STRL NON-REIN LRG LVL3 (GOWN DISPOSABLE) ×4 IMPLANT
GOWN STRL REIN XL XLG (GOWN DISPOSABLE) ×2 IMPLANT
KIT BASIN OR (CUSTOM PROCEDURE TRAY) ×2 IMPLANT
KIT ROOM TURNOVER OR (KITS) ×2 IMPLANT
MESH PARIETEX 20X20CM (Mesh General) ×2 IMPLANT
NEEDLE INSUFFLATION 14GA 120MM (NEEDLE) ×2 IMPLANT
NS IRRIG 1000ML POUR BTL (IV SOLUTION) ×2 IMPLANT
PAD ARMBOARD 7.5X6 YLW CONV (MISCELLANEOUS) ×4 IMPLANT
RELOAD STAPLE HERNIA 4.0 BLUE (INSTRUMENTS) ×2 IMPLANT
RELOAD STAPLE HERNIA 4.8 BLK (STAPLE) IMPLANT
SCISSORS LAP 5X35 DISP (ENDOMECHANICALS) ×2 IMPLANT
SET IRRIG TUBING LAPAROSCOPIC (IRRIGATION / IRRIGATOR) IMPLANT
SET TROCAR LAP APPLE-HUNT 5MM (ENDOMECHANICALS) ×2 IMPLANT
SPONGE GAUZE 2X2 STER 10/PKG (GAUZE/BANDAGES/DRESSINGS) ×1
STAPLER HERNIA 12 8.5 360D (INSTRUMENTS) ×2 IMPLANT
SUT MNCRL AB 4-0 PS2 18 (SUTURE) ×2 IMPLANT
SUT VIC AB 1 CT1 27 (SUTURE) ×1
SUT VIC AB 1 CT1 27XBRD ANBCTR (SUTURE) ×1 IMPLANT
TAPE CLOTH SURG 4X10 WHT LF (GAUZE/BANDAGES/DRESSINGS) ×2 IMPLANT
TOWEL OR 17X24 6PK STRL BLUE (TOWEL DISPOSABLE) ×2 IMPLANT
TOWEL OR 17X26 10 PK STRL BLUE (TOWEL DISPOSABLE) ×2 IMPLANT
TRAY FOLEY CATH 14FR (SET/KITS/TRAYS/PACK) ×2 IMPLANT
TRAY LAPAROSCOPIC (CUSTOM PROCEDURE TRAY) ×2 IMPLANT
TROCAR XCEL 12X100 BLDLESS (ENDOMECHANICALS) ×2 IMPLANT
TROCAR XCEL BLADELESS 5X75MML (TROCAR) ×4 IMPLANT

## 2012-10-23 NOTE — Anesthesia Postprocedure Evaluation (Signed)
  Anesthesia Post-op Note  Patient: [name]  Procedure(s) Performed: Procedure(s) (LRB) with comments: LAPAROSCOPIC INGUINAL HERNIA (Left) INSERTION OF MESH (Left)  Patient Location: PACU  Anesthesia Type:General  Level of Consciousness: awake  Airway and Oxygen Therapy: Patient Spontanous Breathing  Post-op Pain: mild  Post-op Assessment: Post-op Vital signs reviewed  Post-op Vital Signs: Reviewed  Complications: No apparent anesthesia complications

## 2012-10-23 NOTE — Anesthesia Preprocedure Evaluation (Addendum)
Anesthesia Evaluation  Patient identified by MRN, date of birth, ID band Patient awake    Reviewed: Allergy & Precautions, H&P , NPO status , Patient's Chart, lab work & pertinent test results  Airway Mallampati: II      Dental   Pulmonary neg pulmonary ROS,  breath sounds clear to auscultation        Cardiovascular hypertension, Rhythm:Regular Rate:Normal     Neuro/Psych    GI/Hepatic Neg liver ROS,   Endo/Other  negative endocrine ROS  Renal/GU negative Renal ROS     Musculoskeletal   Abdominal   Peds  Hematology   Anesthesia Other Findings   Reproductive/Obstetrics                          Anesthesia Physical Anesthesia Plan  ASA: II  Anesthesia Plan: General   Post-op Pain Management:    Induction: Intravenous  Airway Management Planned: Oral ETT  Additional Equipment:   Intra-op Plan:   Post-operative Plan: Extubation in OR  Informed Consent: I have reviewed the patients History and Physical, chart, labs and discussed the procedure including the risks, benefits and alternatives for the proposed anesthesia with the patient or authorized representative who has indicated his/her understanding and acceptance.   Dental advisory given  Plan Discussed with: Anesthesiologist, CRNA and Surgeon  Anesthesia Plan Comments:         Anesthesia Quick Evaluation

## 2012-10-23 NOTE — Transfer of Care (Signed)
Immediate Anesthesia Transfer of Care Note  Patient: [name]  Procedure(s) Performed: Procedure(s) (LRB) with comments: LAPAROSCOPIC INGUINAL HERNIA (Left) INSERTION OF MESH (Left)  Patient Location: PACU  Anesthesia Type:General  Level of Consciousness: awake, alert  and oriented  Airway & Oxygen Therapy: Patient Spontanous Breathing and Patient connected to nasal cannula oxygen  Post-op Assessment: Report given to PACU RN and Post -op Vital signs reviewed and stable  Post vital signs: Reviewed and stable  Complications: No apparent anesthesia complications

## 2012-10-23 NOTE — Progress Notes (Signed)
NOTIFIED DR. Randa Evens OF CXR RESULTS. PATIENT STATES HE WAS COUGHING UP YELLOW, BUT NO LONGER.  PATIENT IS AFEBRILE,  DR EDWARDS STATES WE DO NOT NEED TO REPEAT.

## 2012-10-23 NOTE — Op Note (Signed)
Pre Operative Diagnosis: [facility]   Post Operative Diagnosis: same  Procedure: laparoscopic left inguinal hernia repair with mesh  Surgeon: Dr. Axel Filler  Assistant: none  Anesthesia: GETA  EBL: 10cc   Complications: none  Counts: reported as correct x 2  Findings:  The patient had a small right indirect hernia  Indications for procedure:  The patient is a 57 year old male with a left-sided hernia for several months. Patient complained of symptomatology to his leftgroin. The patient was taken back for elective right repair.  Details of the procedure:The patient was taken back to the operating room. The patient was placed in supine position with bilateral SCDs in place. After appropriate anitbiotics were confirmed, a time-out was confirmed and all facts were verified.  0.25% Marcaine was used to infiltrate the umbilical area. He was used to cut down the skin and blunt dissection was used to get the anterior fashion.  The anterior fascia was incised approximately 1 cm and the muscles were divided anteriorly. Blunt dissection was then used to create a space in the preperitoneal area. At this time a 10 mm camera was then introduced into the space and advanced the pubic tubercle and a 12 mm trocar was placed over this and insufflation was started.  At this time and space was created from medial to laterally the preperitoneal space. The hernia sac was identified. Dissection of the hernia sac and cord lipoma was undertaken the vas deferens was identified and protected in all parts of the case.  There was a small tear into the hernia sac. A Veress needle right upper quadrant to help evacuate the intraperitoneal air.  Once the hernia sac was taken down to approximately the umbilicus to TET 20x20 Parietex mesh was cut into shape and introduced into the preperitoneal space.  The mesh was brought over the hernia defect and anchored into place and secured to Cooper's ligament with 4.0 there was a hernia  stapler staples. It was anchored to the anterior abdominal wall with 4.8 mm staples. The hernia sac was seen over the mesh. There was no staples placed laterally. The insufflation was evacuated. The trochars were removed. The anterior fascia was reapproximated using #1 Vicryl on a UR- 6.  Intra-abdominal air was evacuated the Veress needle. The skin was reapproximated using 4-0 Monocryl subcuticular fashion the patient was awakened from general anesthesia and taken to recovery in stable condition.

## 2012-10-23 NOTE — H&P (Signed)
  HPI  [name] is a 57 y.o. male referred by Dr. Mayford Knife for a left inguinal hernia. The patient is a 89 Spanish-speaking only with a continued history of elective or hernia. Patient states that the hernia is now becoming more and more painful it has grown slightly. Patient states at this time secondary to pain like to have this electively fixed. He states he is able to reduce the hernia while at home,and has had no signs of incarceration or needed to proceed daily for any other problems. Patient does say he doesn't at times have pain in his chest has not seen a cardiologist because of this. Patient does have a history of right ingrown hernia repair in the past.  HPI  Past Medical History   Diagnosis  Date   .  Depression    .  Thyroid disease    .  ED (erectile dysfunction)    .  Hypertension    No past surgical history on file.  No family history on file.  Social History  History   Substance Use Topics   .  Smoking status:  Never Smoker   .  Smokeless tobacco:  Not on file   .  Alcohol Use:  Not on file   No Known Allergies  Current Outpatient Prescriptions   Medication  Sig  Dispense  Refill   .  Ciprofloxacin (CIPRO PO)  Take by mouth.     .  fluorescein-benoxinate (FLURATE) ophthalmic solution  1 drop once.     .  potassium chloride SA (K-DUR,KLOR-CON) 20 MEQ tablet  Take 20 mEq by mouth 2 (two) times daily.     Marland Kitchen  aspirin 81 MG tablet  Take 81 mg by mouth daily.     Review of Systems  Review of Systems  Constitutional: Negative.  HENT: Negative.  Cardiovascular: Negative.  Gastrointestinal: Negative.  Neurological: Negative.  Blood pressure 126/80, pulse 75, temperature 98 F (36.7 C), temperature source Oral, resp. rate 18, height 5\' 5"  (1.651 m), weight 187 lb (84.823 kg).  Physical Exam  Physical Exam  Constitutional: He is oriented to person, place, and time. He appears well-developed and well-nourished.  HENT:  Head: Normocephalic and atraumatic.  Eyes:  Conjunctivae normal and EOM are normal. Pupils are equal, round, and reactive to light.  Neck: Normal range of motion.  Cardiovascular: Normal rate, regular rhythm and normal heart sounds.  Pulmonary/Chest: Effort normal and breath sounds normal.  Abdominal: Soft. Bowel sounds are normal. A hernia is present. Hernia confirmed positive in the left inguinal area (reducible).  Musculoskeletal: Normal range of motion.  Neurological: He is alert and oriented to person, place, and time.  Data Reviewed  none  Assessment  The patient is a 57 year old male with a left reducible inguinal hernia moderate-sized. The patient does have a history of having chest pain and refer him for cardiac evaluation prior to surgery.  Plan  1. Pt has had w/u by Dr. Jens Som and cleared from a cardiac standpoint for surgery.  2.  We will proceed with laparoscopic left inguinal hernia repair with mesh.  3. All risks and benefits were discussed with the patient, to generally include infection, bleeding, damage to surrounding structures, and recurrence. Alternatives were offered and described. All questions were answered and the patient voiced understanding of the procedure and wishes to proceed at this point.

## 2012-10-24 ENCOUNTER — Telehealth (INDEPENDENT_AMBULATORY_CARE_PROVIDER_SITE_OTHER): Payer: Self-pay | Admitting: General Surgery

## 2012-10-25 ENCOUNTER — Encounter (HOSPITAL_COMMUNITY): Payer: Self-pay | Admitting: General Surgery

## 2012-11-08 ENCOUNTER — Ambulatory Visit (INDEPENDENT_AMBULATORY_CARE_PROVIDER_SITE_OTHER): Payer: Self-pay | Admitting: General Surgery

## 2012-11-08 ENCOUNTER — Encounter (INDEPENDENT_AMBULATORY_CARE_PROVIDER_SITE_OTHER): Payer: Self-pay | Admitting: General Surgery

## 2012-11-08 VITALS — BP 102/64 | HR 70 | Temp 97.1°F | Resp 14 | Ht 65.0 in | Wt 186.6 lb

## 2012-11-08 DIAGNOSIS — Z9889 Other specified postprocedural states: Secondary | ICD-10-CM

## 2012-11-08 DIAGNOSIS — Z8719 Personal history of other diseases of the digestive system: Secondary | ICD-10-CM

## 2012-11-08 NOTE — Progress Notes (Signed)
Patient ID: [name], male   DOB: 03/30/1956, 57 y.o.   MRN: [id number] The patient is a 57 year old male status post left inguinal hernia repair with mesh. Postoperatively patient has been doing well without any pain. Patient continues with Weight Restrictions at this time.  On exam: The wound clean dry and intact There is no hernia palpation  Assessment and plan: 57 year old male status post left inguinal hernia repair with Mesh  -the patient continue with prescriptions for one month. He can then continue with regular lifting at that time.  -To followup when necessary

## 2013-10-26 ENCOUNTER — Inpatient Hospital Stay (HOSPITAL_COMMUNITY)
Admission: EM | Admit: 2013-10-26 | Discharge: 2013-11-01 | DRG: 906 | Disposition: A | Discharge status: Home / Self Care | Payer: Self-pay | Attending: Orthopedic Surgery | Admitting: Orthopedic Surgery

## 2013-10-26 ENCOUNTER — Emergency Department (HOSPITAL_COMMUNITY): Payer: Self-pay

## 2013-10-26 ENCOUNTER — Encounter (HOSPITAL_COMMUNITY): Payer: Self-pay | Admitting: Emergency Medicine

## 2013-10-26 ENCOUNTER — Emergency Department (HOSPITAL_COMMUNITY): Payer: Self-pay | Admitting: Certified Registered"

## 2013-10-26 ENCOUNTER — Encounter (HOSPITAL_COMMUNITY)
Admission: EM | Disposition: A | Discharge status: Home / Self Care | Payer: Self-pay | Source: Home / Self Care | Attending: Orthopedic Surgery

## 2013-10-26 ENCOUNTER — Encounter (HOSPITAL_COMMUNITY): Payer: Self-pay | Admitting: Certified Registered"

## 2013-10-26 DIAGNOSIS — S6722XA Crushing injury of left hand, initial encounter: Secondary | ICD-10-CM

## 2013-10-26 DIAGNOSIS — E7800 Pure hypercholesterolemia, unspecified: Secondary | ICD-10-CM | POA: Diagnosis present

## 2013-10-26 DIAGNOSIS — S68019A Complete traumatic metacarpophalangeal amputation of unspecified thumb, initial encounter: Secondary | ICD-10-CM | POA: Diagnosis present

## 2013-10-26 DIAGNOSIS — S68012A Complete traumatic metacarpophalangeal amputation of left thumb, initial encounter: Secondary | ICD-10-CM | POA: Diagnosis present

## 2013-10-26 DIAGNOSIS — Z87891 Personal history of nicotine dependence: Secondary | ICD-10-CM

## 2013-10-26 DIAGNOSIS — I10 Essential (primary) hypertension: Secondary | ICD-10-CM | POA: Diagnosis present

## 2013-10-26 DIAGNOSIS — S6720XA Crushing injury of unspecified hand, initial encounter: Principal | ICD-10-CM | POA: Diagnosis present

## 2013-10-26 DIAGNOSIS — W230XXA Caught, crushed, jammed, or pinched between moving objects, initial encounter: Secondary | ICD-10-CM | POA: Diagnosis present

## 2013-10-26 HISTORY — DX: Complete traumatic metacarpophalangeal amputation of left thumb, initial encounter: S68.012A

## 2013-10-26 HISTORY — PX: I & D EXTREMITY: SHX5045

## 2013-10-26 LAB — CBC WITH DIFFERENTIAL/PLATELET
BASOS ABS: 0 10*3/uL (ref 0.0–0.1)
BASOS PCT: 0 % (ref 0–1)
EOS PCT: 3 % (ref 0–5)
Eosinophils Absolute: 0.4 10*3/uL (ref 0.0–0.7)
HCT: 43.5 % (ref 39.0–52.0)
Hemoglobin: 15.7 g/dL (ref 13.0–17.0)
LYMPHS PCT: 19 % (ref 12–46)
Lymphs Abs: 2.4 10*3/uL (ref 0.7–4.0)
MCH: 31.4 pg (ref 26.0–34.0)
MCHC: 36.1 g/dL — AB (ref 30.0–36.0)
MCV: 87 fL (ref 78.0–100.0)
Monocytes Absolute: 0.6 10*3/uL (ref 0.1–1.0)
Monocytes Relative: 5 % (ref 3–12)
Neutro Abs: 9.4 10*3/uL — ABNORMAL HIGH (ref 1.7–7.7)
Neutrophils Relative %: 73 % (ref 43–77)
PLATELETS: 164 10*3/uL (ref 150–400)
RBC: 5 MIL/uL (ref 4.22–5.81)
RDW: 13.1 % (ref 11.5–15.5)
WBC: 12.8 10*3/uL — AB (ref 4.0–10.5)

## 2013-10-26 LAB — BASIC METABOLIC PANEL
BUN: 16 mg/dL (ref 6–23)
CALCIUM: 8.5 mg/dL (ref 8.4–10.5)
CO2: 26 meq/L (ref 19–32)
Chloride: 105 mEq/L (ref 96–112)
Creatinine, Ser: 0.84 mg/dL (ref 0.50–1.35)
Glucose, Bld: 116 mg/dL — ABNORMAL HIGH (ref 70–99)
Potassium: 4 mEq/L (ref 3.7–5.3)
SODIUM: 144 meq/L (ref 137–147)

## 2013-10-26 SURGERY — IRRIGATION AND DEBRIDEMENT EXTREMITY
Anesthesia: General | Site: Hand | Laterality: Left

## 2013-10-26 MED ORDER — TETANUS-DIPHTH-ACELL PERTUSSIS 5-2.5-18.5 LF-MCG/0.5 IM SUSP: 0.5000 mL | Freq: Once | INTRAMUSCULAR | Status: DC

## 2013-10-26 MED ORDER — LIDOCAINE HCL (PF) 1 % IJ SOLN
INTRAMUSCULAR | Status: AC
  Filled 2013-10-26: qty 30

## 2013-10-26 MED ORDER — SUCCINYLCHOLINE CHLORIDE 20 MG/ML IJ SOLN
INTRAMUSCULAR | Status: DC | PRN
  Administered 2013-10-26: 120 mg via INTRAVENOUS

## 2013-10-26 MED ORDER — OXYCODONE HCL 5 MG PO TABS: 5.0000 mg | ORAL_TABLET | Freq: Once | ORAL | Status: DC | PRN

## 2013-10-26 MED ORDER — LIDOCAINE HCL (CARDIAC) 20 MG/ML IV SOLN
INTRAVENOUS | Status: DC | PRN
  Administered 2013-10-26: 100 mg via INTRAVENOUS

## 2013-10-26 MED ORDER — ONDANSETRON HCL 4 MG/2ML IJ SOLN: 4.0000 mg | Freq: Once | INTRAMUSCULAR | Status: DC | PRN

## 2013-10-26 MED ORDER — DEXAMETHASONE SODIUM PHOSPHATE 4 MG/ML IJ SOLN
INTRAMUSCULAR | Status: DC | PRN
  Administered 2013-10-26: 4 mg via INTRAVENOUS

## 2013-10-26 MED ORDER — CEFAZOLIN SODIUM-DEXTROSE 2-3 GM-% IV SOLR
INTRAVENOUS | Status: DC | PRN
  Administered 2013-10-26: 2 g via INTRAVENOUS

## 2013-10-26 MED ORDER — 0.9 % SODIUM CHLORIDE (POUR BTL) OPTIME
TOPICAL | Status: DC | PRN
  Administered 2013-10-26: 1000 mL

## 2013-10-26 MED ORDER — LIDOCAINE HCL 1 % IJ SOLN
INTRAMUSCULAR | Status: DC | PRN
  Administered 2013-10-26: 30 mL

## 2013-10-26 MED ORDER — PROPOFOL 10 MG/ML IV BOLUS
INTRAVENOUS | Status: DC | PRN
  Administered 2013-10-26: 170 mg via INTRAVENOUS

## 2013-10-26 MED ORDER — HYDROMORPHONE HCL PF 1 MG/ML IJ SOLN
1.0000 mg | Freq: Once | INTRAMUSCULAR | Status: AC
  Administered 2013-10-26: 1 mg via INTRAVENOUS
  Filled 2013-10-26: qty 1

## 2013-10-26 MED ORDER — SUFENTANIL CITRATE 50 MCG/ML IV SOLN
INTRAVENOUS | Status: DC | PRN
  Administered 2013-10-26: 20 ug via INTRAVENOUS

## 2013-10-26 MED ORDER — SODIUM CHLORIDE 0.9 % IR SOLN
Status: DC | PRN
  Administered 2013-10-26: 5000 mL

## 2013-10-26 MED ORDER — OXYCODONE HCL 5 MG/5ML PO SOLN
5.0000 mg | Freq: Once | ORAL | Status: DC | PRN
Start: 2013-10-26 — End: 2013-10-26

## 2013-10-26 MED ORDER — HYDROMORPHONE HCL PF 1 MG/ML IJ SOLN: 0.2500 mg | INTRAMUSCULAR | Status: DC | PRN

## 2013-10-26 MED ORDER — EPHEDRINE SULFATE 50 MG/ML IJ SOLN
INTRAMUSCULAR | Status: DC | PRN
  Administered 2013-10-26 (×3): 10 mg via INTRAVENOUS
  Administered 2013-10-26: 20 mg via INTRAVENOUS

## 2013-10-26 MED ORDER — ONDANSETRON HCL 4 MG/2ML IJ SOLN
4.0000 mg | Freq: Once | INTRAMUSCULAR | Status: AC
  Administered 2013-10-26: 4 mg via INTRAVENOUS
  Filled 2013-10-26: qty 2

## 2013-10-26 MED ORDER — MIDAZOLAM HCL 5 MG/5ML IJ SOLN
INTRAMUSCULAR | Status: DC | PRN
  Administered 2013-10-26: 2 mg via INTRAVENOUS

## 2013-10-26 MED ORDER — ONDANSETRON HCL 4 MG/2ML IJ SOLN
INTRAMUSCULAR | Status: DC | PRN
  Administered 2013-10-26: 4 mg via INTRAVENOUS

## 2013-10-26 MED ORDER — CEFAZOLIN SODIUM 1-5 GM-% IV SOLN
1.0000 g | Freq: Once | INTRAVENOUS | Status: DC
  Filled 2013-10-26: qty 50

## 2013-10-26 MED ORDER — LACTATED RINGERS IV SOLN
INTRAVENOUS | Status: DC | PRN
  Administered 2013-10-26 (×2): via INTRAVENOUS

## 2013-10-26 SURGICAL SUPPLY — 48 items
BANDAGE CONFORM 2  STR LF (GAUZE/BANDAGES/DRESSINGS) IMPLANT
BANDAGE ELASTIC 3 VELCRO ST LF (GAUZE/BANDAGES/DRESSINGS) ×3 IMPLANT
BANDAGE ELASTIC 4 VELCRO ST LF (GAUZE/BANDAGES/DRESSINGS) ×3 IMPLANT
BANDAGE GAUZE ELAST BULKY 4 IN (GAUZE/BANDAGES/DRESSINGS) ×3 IMPLANT
BNDG ESMARK 4X9 LF (GAUZE/BANDAGES/DRESSINGS) ×3 IMPLANT
CORDS BIPOLAR (ELECTRODE) ×3 IMPLANT
CUFF TOURNIQUET SINGLE 18IN (TOURNIQUET CUFF) ×3 IMPLANT
CUFF TOURNIQUET SINGLE 24IN (TOURNIQUET CUFF) IMPLANT
DRSG ADAPTIC 3X8 NADH LF (GAUZE/BANDAGES/DRESSINGS) ×3 IMPLANT
ELECT REM PT RETURN 9FT ADLT (ELECTROSURGICAL)
ELECTRODE REM PT RTRN 9FT ADLT (ELECTROSURGICAL) IMPLANT
GAUZE SPONGE 4X4 16PLY XRAY LF (GAUZE/BANDAGES/DRESSINGS) ×3 IMPLANT
GAUZE XEROFORM 1X8 LF (GAUZE/BANDAGES/DRESSINGS) ×3 IMPLANT
GAUZE XEROFORM 5X9 LF (GAUZE/BANDAGES/DRESSINGS) ×3 IMPLANT
GLOVE BIOGEL M STRL SZ7.5 (GLOVE) ×3 IMPLANT
GLOVE SS BIOGEL STRL SZ 8 (GLOVE) ×2 IMPLANT
GLOVE SUPERSENSE BIOGEL SZ 8 (GLOVE) ×4
GOWN SRG XL XLNG 56XLVL 4 (GOWN DISPOSABLE) ×3 IMPLANT
GOWN STRL NON-REIN LRG LVL3 (GOWN DISPOSABLE) ×9 IMPLANT
GOWN STRL NON-REIN XL XLG LVL4 (GOWN DISPOSABLE) ×6
GUIDEWIRE ORTH 6X062XTROC NS (WIRE) ×3 IMPLANT
HANDPIECE INTERPULSE COAX TIP (DISPOSABLE) ×2
K-WIRE .062 (WIRE) ×6
KIT BASIN OR (CUSTOM PROCEDURE TRAY) ×3 IMPLANT
KIT ROOM TURNOVER OR (KITS) ×3 IMPLANT
MANIFOLD NEPTUNE II (INSTRUMENTS) ×3 IMPLANT
NS IRRIG 1000ML POUR BTL (IV SOLUTION) ×3 IMPLANT
PACK ORTHO EXTREMITY (CUSTOM PROCEDURE TRAY) ×3 IMPLANT
PAD ARMBOARD 7.5X6 YLW CONV (MISCELLANEOUS) ×6 IMPLANT
PAD CAST 4YDX4 CTTN HI CHSV (CAST SUPPLIES) ×1 IMPLANT
PADDING CAST COTTON 4X4 STRL (CAST SUPPLIES) ×2
SET HNDPC FAN SPRY TIP SCT (DISPOSABLE) ×1 IMPLANT
SPEAR EYE SURG WECK-CEL (MISCELLANEOUS) ×6 IMPLANT
SPLINT FIBERGLASS 4X30 (CAST SUPPLIES) ×3 IMPLANT
SPONGE GAUZE 4X4 12PLY (GAUZE/BANDAGES/DRESSINGS) ×6 IMPLANT
SPONGE GAUZE 4X4 12PLY STER LF (GAUZE/BANDAGES/DRESSINGS) ×3 IMPLANT
SPONGE LAP 18X18 X RAY DECT (DISPOSABLE) IMPLANT
SPONGE LAP 4X18 X RAY DECT (DISPOSABLE) ×3 IMPLANT
SPONGE SURGIFOAM ABS GEL 12-7 (HEMOSTASIS) ×3 IMPLANT
SUT ETHILON 8 0 BV130 4 (SUTURE) ×6 IMPLANT
SUT PROLENE 4 0 P 3 18 (SUTURE) ×9 IMPLANT
SYR CONTROL 10ML LL (SYRINGE) ×3 IMPLANT
TOWEL OR 17X24 6PK STRL BLUE (TOWEL DISPOSABLE) ×3 IMPLANT
TOWEL OR 17X26 10 PK STRL BLUE (TOWEL DISPOSABLE) ×3 IMPLANT
TUBE CONNECTING 12'X1/4 (SUCTIONS) ×1
TUBE CONNECTING 12X1/4 (SUCTIONS) ×2 IMPLANT
WATER STERILE IRR 1000ML POUR (IV SOLUTION) ×3 IMPLANT
YANKAUER SUCT BULB TIP NO VENT (SUCTIONS) ×3 IMPLANT
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
## 2013-10-26 NOTE — Transfer of Care (Signed)
Immediate Anesthesia Transfer of Care Note  Patient: [name]  Procedure(s) Performed: Procedure(s): IRRIGATION AND DEBRIDEMENT EXTREMITY AND REPAIR AS NECESSARY (Left)  Patient Location: PACU  Anesthesia Type:General  Level of Consciousness: sedated, patient cooperative and responds to stimulation  Airway & Oxygen Therapy: Patient Spontanous Breathing and Patient connected to nasal cannula oxygen  Post-op Assessment: Report given to PACU RN, Post -op Vital signs reviewed and stable and Patient moving all extremities X 4  Post vital signs: Reviewed and stable  Complications: No apparent anesthesia complications

## 2013-10-26 NOTE — ED Provider Notes (Signed)
Medical screening examination/treatment/procedure(s) were conducted as a shared visit with non-physician practitioner(s) and myself.  I personally evaluated the patient during the encounter.  EKG Interpretation    Date/Time:  Saturday October 26 2013 19:25:27 EST Ventricular Rate:  75 PR Interval:  180 QRS Duration: 111 QT Interval:  382 QTC Calculation: 427 R Axis:   34 Text Interpretation:  Sinus rhythm Probable left atrial enlargement RSR' in V1 or V2, right VCD or RVH Baseline wander in lead(s) V4 Otherwise normal ECG No significant change since last tracing Confirmed by [facility]  MD, MICHEAL [phone]) on 10/26/2013 7:38:21 PM            Pt with crush injury to left hand, came in with tourniquet in place due to heavy non pulsatile bleeding at the scene, not improved with pressure immediately.  Pt last ate about 4 hours ago.  No other sig PMH.  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  Thumb is devascularized, cool, blue.  Large near circumferential, deep, complex laceration around hand between first and second digits.  Will need consult from hand surgeon and need to go to the OR.  Good RP.  Decreased cap refill to thumb.  Preop labs, xryas and xray of hand are ordered.    Saddie Benders. Dorna Mai, MD 10/26/13 3893

## 2013-10-26 NOTE — Op Note (Signed)
See Dictation #[id number] Amedeo Plenty Md

## 2013-10-26 NOTE — ED Notes (Signed)
Portable xray at bedside.
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
## 2013-10-26 NOTE — ED Notes (Signed)
Surgeon at bedside.  

## 2013-10-26 NOTE — ED Notes (Signed)
Valuables placed in envelope and turned into security upon arrival.

## 2013-10-27 MED ORDER — DEXTRAN 40 IN D5W 10 % IV SOLN
25.0000 mL/h | INTRAVENOUS | Status: DC
  Filled 2013-10-27: qty 500

## 2013-10-27 MED ORDER — PROMETHAZINE HCL 25 MG RE SUPP
12.5000 mg | Freq: Four times a day (QID) | RECTAL | Status: DC | PRN
Start: 2013-10-27 — End: 2013-11-01

## 2013-10-27 MED ORDER — DEXTRAN 40 IN SALINE 10-0.9 % IV SOLN
25.0000 mL/h | INTRAVENOUS | Status: DC
  Administered 2013-10-27: 25 mL/h via INTRAVENOUS
  Filled 2013-10-27 (×4): qty 500

## 2013-10-27 MED ORDER — FAMOTIDINE 20 MG PO TABS
20.0000 mg | ORAL_TABLET | Freq: Two times a day (BID) | ORAL | Status: DC | PRN
  Filled 2013-10-27: qty 1

## 2013-10-27 MED ORDER — ASPIRIN 325 MG PO TABS
325.0000 mg | ORAL_TABLET | Freq: Two times a day (BID) | ORAL | Status: DC
  Administered 2013-10-27 – 2013-11-01 (×11): 325 mg via ORAL
  Filled 2013-10-27 (×13): qty 1

## 2013-10-27 MED ORDER — ONDANSETRON HCL 4 MG PO TABS: 4.0000 mg | ORAL_TABLET | Freq: Four times a day (QID) | ORAL | Status: DC | PRN

## 2013-10-27 MED ORDER — LACTATED RINGERS IV SOLN
INTRAVENOUS | Status: DC
  Administered 2013-10-27 – 2013-10-31 (×4): via INTRAVENOUS

## 2013-10-27 MED ORDER — ALPRAZOLAM 0.5 MG PO TABS
0.5000 mg | ORAL_TABLET | Freq: Four times a day (QID) | ORAL | Status: DC | PRN
  Administered 2013-10-27 (×2): 0.5 mg via ORAL
  Filled 2013-10-27 (×2): qty 1

## 2013-10-27 MED ORDER — DOCUSATE SODIUM 100 MG PO CAPS
100.0000 mg | ORAL_CAPSULE | Freq: Two times a day (BID) | ORAL | Status: DC
  Administered 2013-10-27 – 2013-11-01 (×11): 100 mg via ORAL
  Filled 2013-10-27 (×14): qty 1

## 2013-10-27 MED ORDER — OXYCODONE HCL 5 MG PO TABS
5.0000 mg | ORAL_TABLET | ORAL | Status: DC | PRN
  Administered 2013-10-27 – 2013-11-01 (×26): 10 mg via ORAL
  Filled 2013-10-27 (×26): qty 2

## 2013-10-27 MED ORDER — HYDROMORPHONE HCL PF 1 MG/ML IJ SOLN
1.0000 mg | INTRAMUSCULAR | Status: DC | PRN
  Administered 2013-10-27 – 2013-10-29 (×4): 1 mg via INTRAVENOUS
  Filled 2013-10-27 (×4): qty 1

## 2013-10-27 MED ORDER — CEFAZOLIN SODIUM 1-5 GM-% IV SOLN
1.0000 g | Freq: Three times a day (TID) | INTRAVENOUS | Status: DC
  Administered 2013-10-27 – 2013-10-28 (×6): 1 g via INTRAVENOUS
  Administered 2013-10-29: 2 g via INTRAVENOUS
  Administered 2013-10-29 – 2013-11-01 (×10): 1 g via INTRAVENOUS
  Filled 2013-10-27 (×20): qty 50

## 2013-10-27 MED ORDER — VITAMIN C 500 MG PO TABS
1000.0000 mg | ORAL_TABLET | Freq: Every day | ORAL | Status: DC
  Administered 2013-10-27 – 2013-11-01 (×5): 1000 mg via ORAL
  Filled 2013-10-27 (×6): qty 2

## 2013-10-27 MED ORDER — ONDANSETRON HCL 4 MG/2ML IJ SOLN: 4.0000 mg | Freq: Four times a day (QID) | INTRAMUSCULAR | Status: DC | PRN

## 2013-10-27 MED ORDER — MORPHINE SULFATE 2 MG/ML IJ SOLN
1.0000 mg | INTRAMUSCULAR | Status: DC | PRN
  Administered 2013-10-27 (×2): 1 mg via INTRAVENOUS
  Filled 2013-10-27 (×2): qty 1

## 2013-10-27 MED ORDER — INFLUENZA VAC SPLIT QUAD 0.5 ML IM SUSP
0.5000 mL | INTRAMUSCULAR | Status: AC
  Administered 2013-10-30: 0.5 mL via INTRAMUSCULAR
  Filled 2013-10-27: qty 0.5

## 2013-10-27 NOTE — Progress Notes (Signed)
Pt has started complaining of left elbow and distal humerus pain. Pain score is 7 or greater. Current pain meds are not relieving his pain. Dr. Caralyn Guile (on call) notified. Ordered dilaudid and recommended that further evaluation of elbow be made in the morning.

## 2013-10-27 NOTE — Progress Notes (Signed)
Utilization review completed.  

## 2013-10-27 NOTE — Anesthesia Postprocedure Evaluation (Signed)
  Anesthesia Post-op Note  Patient: [name]  Procedure(s) Performed: Procedure(s): IRRIGATION AND DEBRIDEMENT EXTREMITY AND REPAIR AS NECESSARY (Left)  Patient Location: PACU  Anesthesia Type:General  Level of Consciousness: awake, alert  and oriented  Airway and Oxygen Therapy: Patient Spontanous Breathing  Post-op Pain: mild  Post-op Assessment: Post-op Vital signs reviewed, Patient's Cardiovascular Status Stable, Respiratory Function Stable, Patent Airway and Pain level controlled  Post-op Vital Signs: stable  Complications: No apparent anesthesia complications
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
## 2013-10-27 NOTE — Progress Notes (Signed)
Subjective: 1 Day Post-Op Procedure(s) (LRB): IRRIGATION AND DEBRIDEMENT EXTREMITY AND REPAIR AS NECESSARY (Left) Patient reports pain as minimal, overall he feels as though he's doing well. He denies any nausea or vomiting denies fever chills at this juncture   Objective: Vital signs in last 24 hours: Temp:  [98 F (36.7 C)-98.5 F (36.9 C)] 98.1 F (36.7 C) (01/18 0617) Pulse Rate:  [71-104] 87 (01/18 0617) Resp:  [10-20] 18 (01/18 0617) BP: (93-123)/(61-87) 94/61 mmHg (01/18 0617) SpO2:  [93 %-96 %] 94 % (01/18 0617) Weight:  [85 kg (187 lb 6.3 oz)-92.987 kg (205 lb)] 85 kg (187 lb 6.3 oz) (01/17 2357)  Intake/Output from previous day: 01/17 0701 - 01/18 0700 In: 1600 [P.O.:200; I.V.:1400] Out: -  Intake/Output this shift: Total I/O In: 360 [P.O.:360] Out: -    Recent Labs  10/26/13 1930  HGB 15.7    Recent Labs  10/26/13 1930  WBC 12.8*  RBC 5.00  HCT 43.5  PLT 164    Recent Labs  10/26/13 1930  NA 144  K 4.0  CL 105  CO2 26  BUN 16  CREATININE 0.84  GLUCOSE 116*  CALCIUM 8.5   No results found for this basename: LABPT, INR,  in the last 72 hours  The patient is pleasant in no acute distress  Head atraumatic normocephalic Chest equal expansions are present respirations are nonlabored Left upper extremity shows his dressings are intact I should note he has excellent refill about the digits to include the thumb, sensation of course is diminished to the thumb given the insult and degree of injury to  Digital nerves, he does however maintained excellent refill less than 2 seconds to the thumb status post replant  Assessment/Plan: 1 Day Post-Op Procedure(s) (LRB): IRRIGATION AND DEBRIDEMENT EXTREMITY AND REPAIR AS NECESSARY (Left) We will continue to maintain all precautions has this was a replant to the thumb want to ensure that we maintained the length of the wound and continue his aspirin. Currently his refill looks excellent. I have discussed with  him and go is to continue close observation in hopes that the thumb stay viable, plan to turned to the operative suite over the next 2 days further stabilization and soft tissue repair the do not want to be overly aggressive given his recent revascularization/repair. Venkat Ankney L 10/27/2013, 12:41 PM

## 2013-10-29 ENCOUNTER — Encounter (HOSPITAL_COMMUNITY): Payer: Self-pay | Admitting: Anesthesiology

## 2013-10-29 ENCOUNTER — Encounter (HOSPITAL_COMMUNITY)
Admission: EM | Disposition: A | Discharge status: Home / Self Care | Payer: Self-pay | Source: Home / Self Care | Attending: Orthopedic Surgery

## 2013-10-29 ENCOUNTER — Inpatient Hospital Stay (HOSPITAL_COMMUNITY): Payer: Self-pay | Admitting: Anesthesiology

## 2013-10-29 ENCOUNTER — Encounter (HOSPITAL_COMMUNITY): Payer: Self-pay | Admitting: Orthopedic Surgery

## 2013-10-29 HISTORY — PX: NERVE, TENDON AND ARTERY REPAIR: SHX5695

## 2013-10-29 LAB — SURGICAL PCR SCREEN
MRSA, PCR: NEGATIVE
Staphylococcus aureus: NEGATIVE

## 2013-10-29 SURGERY — NERVE, TENDON AND ARTERY REPAIR
Anesthesia: General | Site: Hand | Laterality: Left

## 2013-10-29 MED ORDER — ALBUTEROL SULFATE HFA 108 (90 BASE) MCG/ACT IN AERS
INHALATION_SPRAY | RESPIRATORY_TRACT | Status: DC | PRN
  Administered 2013-10-29: 4 via RESPIRATORY_TRACT

## 2013-10-29 MED ORDER — FENTANYL CITRATE 0.05 MG/ML IJ SOLN
INTRAMUSCULAR | Status: DC | PRN
  Administered 2013-10-29: 50 ug via INTRAVENOUS
  Administered 2013-10-29: 25 ug via INTRAVENOUS

## 2013-10-29 MED ORDER — OXYCODONE HCL 5 MG/5ML PO SOLN: 5.0000 mg | Freq: Once | ORAL | Status: DC | PRN

## 2013-10-29 MED ORDER — LACTATED RINGERS IV SOLN
INTRAVENOUS | Status: DC | PRN
  Administered 2013-10-29 (×2): via INTRAVENOUS

## 2013-10-29 MED ORDER — BUPIVACAINE HCL (PF) 0.25 % IJ SOLN: INTRAMUSCULAR | Status: DC | PRN

## 2013-10-29 MED ORDER — ONDANSETRON HCL 4 MG/2ML IJ SOLN
INTRAMUSCULAR | Status: DC | PRN
  Administered 2013-10-29: 4 mg via INTRAVENOUS

## 2013-10-29 MED ORDER — LIDOCAINE HCL (CARDIAC) 20 MG/ML IV SOLN
INTRAVENOUS | Status: DC | PRN
  Administered 2013-10-29: 100 mg via INTRAVENOUS

## 2013-10-29 MED ORDER — SODIUM CHLORIDE 0.9 % IR SOLN
Status: DC | PRN
  Administered 2013-10-29: 1000 mL
  Administered 2013-10-29: 5000 mL

## 2013-10-29 MED ORDER — PROPOFOL 10 MG/ML IV BOLUS
INTRAVENOUS | Status: DC | PRN
  Administered 2013-10-29: 200 mg via INTRAVENOUS

## 2013-10-29 MED ORDER — PROMETHAZINE HCL 25 MG/ML IJ SOLN: 6.2500 mg | INTRAMUSCULAR | Status: DC | PRN

## 2013-10-29 MED ORDER — MIDAZOLAM HCL 2 MG/2ML IJ SOLN: 1.0000 mg | INTRAMUSCULAR | Status: DC | PRN

## 2013-10-29 MED ORDER — MIDAZOLAM HCL 5 MG/5ML IJ SOLN
INTRAMUSCULAR | Status: DC | PRN
  Administered 2013-10-29: 2 mg via INTRAVENOUS

## 2013-10-29 MED ORDER — HYDROGEN PEROXIDE 3 % EX SOLN: CUTANEOUS | Status: DC | PRN

## 2013-10-29 MED ORDER — CIPROFLOXACIN HCL 500 MG PO TABS
500.0000 mg | ORAL_TABLET | Freq: Two times a day (BID) | ORAL | Status: DC
  Administered 2013-10-29 – 2013-11-01 (×6): 500 mg via ORAL
  Filled 2013-10-29 (×8): qty 1

## 2013-10-29 MED ORDER — BUPIVACAINE HCL (PF) 0.25 % IJ SOLN
INTRAMUSCULAR | Status: AC
  Filled 2013-10-29: qty 30

## 2013-10-29 MED ORDER — HYDROMORPHONE HCL PF 1 MG/ML IJ SOLN
0.2500 mg | INTRAMUSCULAR | Status: DC | PRN
  Administered 2013-10-29 (×2): 0.5 mg via INTRAVENOUS

## 2013-10-29 MED ORDER — FENTANYL CITRATE 0.05 MG/ML IJ SOLN
50.0000 ug | Freq: Once | INTRAMUSCULAR | Status: DC
Start: 2013-10-29 — End: 2013-10-29

## 2013-10-29 MED ORDER — OXYCODONE HCL 5 MG PO TABS: 5.0000 mg | ORAL_TABLET | Freq: Once | ORAL | Status: DC | PRN

## 2013-10-29 MED ORDER — HYDROMORPHONE HCL PF 1 MG/ML IJ SOLN
INTRAMUSCULAR | Status: AC
  Administered 2013-10-29: 0.5 mg via INTRAVENOUS
  Filled 2013-10-29: qty 1

## 2013-10-29 MED ORDER — PHENYLEPHRINE HCL 10 MG/ML IJ SOLN
INTRAMUSCULAR | Status: DC | PRN
  Administered 2013-10-29 (×2): 80 ug via INTRAVENOUS
  Administered 2013-10-29 (×3): 40 ug via INTRAVENOUS
  Administered 2013-10-29: 80 ug via INTRAVENOUS

## 2013-10-29 SURGICAL SUPPLY — 58 items
0.25%SENSORCAINE -MPF 30ML IMPLANT
BANDAGE CONFORM 3  STR LF (GAUZE/BANDAGES/DRESSINGS) ×3 IMPLANT
BANDAGE ELASTIC 3 VELCRO ST LF (GAUZE/BANDAGES/DRESSINGS) ×3 IMPLANT
BANDAGE ELASTIC 4 VELCRO ST LF (GAUZE/BANDAGES/DRESSINGS) ×3 IMPLANT
BANDAGE GAUZE ELAST BULKY 4 IN (GAUZE/BANDAGES/DRESSINGS) ×3 IMPLANT
BNDG COHESIVE 1X5 TAN STRL LF (GAUZE/BANDAGES/DRESSINGS) IMPLANT
CLOTH BEACON ORANGE TIMEOUT ST (SAFETY) IMPLANT
CORDS BIPOLAR (ELECTRODE) ×3 IMPLANT
COVER SURGICAL LIGHT HANDLE (MISCELLANEOUS) ×3 IMPLANT
CUFF TOURNIQUET SINGLE 18IN (TOURNIQUET CUFF) IMPLANT
CUFF TOURNIQUET SINGLE 24IN (TOURNIQUET CUFF) IMPLANT
DECANTER SPIKE VIAL GLASS SM (MISCELLANEOUS) IMPLANT
DRAPE SURG 17X23 STRL (DRAPES) ×3 IMPLANT
DRSG ADAPTIC 3X8 NADH LF (GAUZE/BANDAGES/DRESSINGS) ×3 IMPLANT
GAUZE SPONGE 2X2 8PLY STRL LF (GAUZE/BANDAGES/DRESSINGS) IMPLANT
GAUZE SPONGE 4X4 16PLY XRAY LF (GAUZE/BANDAGES/DRESSINGS) ×6 IMPLANT
GAUZE XEROFORM 1X8 LF (GAUZE/BANDAGES/DRESSINGS) IMPLANT
GAUZE XEROFORM 5X9 LF (GAUZE/BANDAGES/DRESSINGS) ×3 IMPLANT
GLOVE BIOGEL M STRL SZ7.5 (GLOVE) ×3 IMPLANT
GLOVE BIOGEL PI IND STRL 7.5 (GLOVE) ×1 IMPLANT
GLOVE BIOGEL PI INDICATOR 7.5 (GLOVE) ×2
GLOVE SS BIOGEL STRL SZ 8 (GLOVE) ×1 IMPLANT
GLOVE SUPERSENSE BIOGEL SZ 8 (GLOVE) ×2
GLOVE SURG SS PI 7.5 STRL IVOR (GLOVE) ×3 IMPLANT
GOWN SRG XL XLNG 56XLVL 4 (GOWN DISPOSABLE) ×2 IMPLANT
GOWN STRL NON-REIN LRG LVL3 (GOWN DISPOSABLE) ×3 IMPLANT
GOWN STRL NON-REIN XL XLG LVL4 (GOWN DISPOSABLE) ×4
KIT BASIN OR (CUSTOM PROCEDURE TRAY) ×3 IMPLANT
KIT ROOM TURNOVER OR (KITS) ×3 IMPLANT
LOOP VESSEL MAXI BLUE (MISCELLANEOUS) ×3 IMPLANT
MANIFOLD NEPTUNE II (INSTRUMENTS) ×3 IMPLANT
NEEDLE HYPO 25GX1X1/2 BEV (NEEDLE) ×3 IMPLANT
NS IRRIG 1000ML POUR BTL (IV SOLUTION) ×3 IMPLANT
PACK ORTHO EXTREMITY (CUSTOM PROCEDURE TRAY) ×3 IMPLANT
PAD ARMBOARD 7.5X6 YLW CONV (MISCELLANEOUS) ×6 IMPLANT
PAD CAST 4YDX4 CTTN HI CHSV (CAST SUPPLIES) ×2 IMPLANT
PADDING CAST COTTON 4X4 STRL (CAST SUPPLIES) ×4
SOLUTION BETADINE 4OZ (MISCELLANEOUS) ×3 IMPLANT
SPEAR EYE SURG WECK-CEL (MISCELLANEOUS) IMPLANT
SPECIMEN JAR SMALL (MISCELLANEOUS) IMPLANT
SPLINT FIBERGLASS 4X30 (CAST SUPPLIES) ×3 IMPLANT
SPONGE GAUZE 2X2 STER 10/PKG (GAUZE/BANDAGES/DRESSINGS)
SPONGE GAUZE 4X4 12PLY (GAUZE/BANDAGES/DRESSINGS) IMPLANT
SPONGE GAUZE 4X4 12PLY STER LF (GAUZE/BANDAGES/DRESSINGS) ×3 IMPLANT
SPONGE SCRUB IODOPHOR (GAUZE/BANDAGES/DRESSINGS) ×3 IMPLANT
SUCTION FRAZIER TIP 10 FR DISP (SUCTIONS) IMPLANT
SUT MERSILENE 4 0 P 3 (SUTURE) IMPLANT
SUT PROLENE 4 0 PS 2 18 (SUTURE) ×9 IMPLANT
SUT VIC AB 2-0 CT1 27 (SUTURE)
SUT VIC AB 2-0 CT1 TAPERPNT 27 (SUTURE) IMPLANT
SYR BULB 3OZ (MISCELLANEOUS) ×3 IMPLANT
SYR CONTROL 10ML LL (SYRINGE) ×3 IMPLANT
TOWEL OR 17X24 6PK STRL BLUE (TOWEL DISPOSABLE) ×3 IMPLANT
TOWEL OR 17X26 10 PK STRL BLUE (TOWEL DISPOSABLE) ×3 IMPLANT
TUBE CONNECTING 12'X1/4 (SUCTIONS)
TUBE CONNECTING 12X1/4 (SUCTIONS) IMPLANT
UNDERPAD 30X30 INCONTINENT (UNDERPADS AND DIAPERS) ×3 IMPLANT
WATER STERILE IRR 1000ML POUR (IV SOLUTION) ×3 IMPLANT

## 2013-10-29 NOTE — Transfer of Care (Signed)
Immediate Anesthesia Transfer of Care Note  Patient: [name]  Procedure(s) Performed: Procedure(s): IRRIGATION AND DEBRIDMENT LEFT HAND AND BURYING RADIAL DIGITAL NERVE TO LEFT THUMB (Left)  Patient Location: PACU  Anesthesia Type:General  Level of Consciousness: awake, alert  and oriented  Airway & Oxygen Therapy: Patient Spontanous Breathing and Patient connected to nasal cannula oxygen  Post-op Assessment: Report given to PACU RN, Post -op Vital signs reviewed and stable and Patient moving all extremities X 4  Post vital signs: Reviewed and stable  Complications: No apparent anesthesia complications

## 2013-10-29 NOTE — Preoperative (Signed)
Beta Blockers   Reason not to administer Beta Blockers:Not Applicable 

## 2013-10-29 NOTE — Progress Notes (Signed)
Subjective: 3 Days Post-Op Procedure(s) (LRB): IRRIGATION AND DEBRIDEMENT EXTREMITY AND REPAIR AS NECESSARY (Left) Patient reports pain as minimal. Family in room. Patient in good spirits. Tolerating regular diet, voiding without difficulties. He denies N/V/F/C    Objective: Vital signs in last 24 hours: Temp:  [97.6 F (36.4 C)-98.5 F (36.9 C)] 98.5 F (36.9 C) (01/20 0533) Pulse Rate:  [88-95] 88 (01/20 0533) Resp:  [18] 18 (01/20 0533) BP: (139-156)/(88-100) 139/97 mmHg (01/20 0533) SpO2:  [92 %-96 %] 92 % (01/20 0533)  Intake/Output from previous day: 01/19 0701 - 01/20 0700 In: 1880 [P.O.:1080; I.V.:750; IV Piggyback:50] Out: 2200 [Urine:2200] Intake/Output this shift: Total I/O In: 240 [P.O.:240] Out: 600 [Urine:600]   Recent Labs  10/26/13 1930  HGB 15.7    Recent Labs  10/26/13 1930  WBC 12.8*  RBC 5.00  HCT 43.5  PLT 164    Recent Labs  10/26/13 1930  NA 144  K 4.0  CL 105  CO2 26  BUN 16  CREATININE 0.84  GLUCOSE 116*  CALCIUM 8.5   No results found for this basename: LABPT, INR,  in the last 72 hours  .Marland KitchenThe patient is alert and oriented in no acute distress the patient complains of pain in the affected upper extremity.  The patient is noted to have a normal HEENT exam.  Lung fields show equal chest expansion and no shortness of breath  abdomen exam is nontender without distention.  Lower extremity examination does not show any fracture dislocation or blood clot symptoms.  Pelvis is stable neck and back are stable and nontender Examination of the Lue shows dressings are intact,rom of IF-SF intact, refill and sensation intact, Thumb has excellent refill and is pink , warm and dry, no signs of obvious venous congestion, sensation diminished  Assessment/Plan: 3 Days Post-Op Procedure(s) (LRB): IRRIGATION AND DEBRIDEMENT EXTREMITY AND REPAIR AS NECESSARY (Left) I have discussed with the patient and his family all issues, we will plan on  returning to the OR tomorrow for Repeat I & D and revision of the hand. NPO after midnight. All questions encouraged and answered.  Isak Sotomayor L 10/29/2013, 6:13 AM
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
## 2013-10-29 NOTE — Op Note (Signed)
See Dictation #[id number] Hardie Pulley Md

## 2013-10-30 MED ORDER — BISACODYL 10 MG RE SUPP
10.0000 mg | Freq: Every day | RECTAL | Status: DC | PRN
  Administered 2013-10-31: 10 mg via RECTAL
  Filled 2013-10-30 (×2): qty 1

## 2013-10-30 MED ORDER — MAGNESIUM HYDROXIDE 400 MG/5ML PO SUSP
30.0000 mL | Freq: Every day | ORAL | Status: DC | PRN
  Administered 2013-10-30: 30 mL via ORAL
  Filled 2013-10-30: qty 30

## 2013-10-30 NOTE — Progress Notes (Signed)
Subjective: 1 Day Post-Op Procedure(s) (LRB): IRRIGATION AND DEBRIDMENT LEFT HAND AND BURYING RADIAL DIGITAL NERVE TO LEFT THUMB (Left) Patient reports pain as mild.    Objective: Vital signs in last 24 hours: Temp:  [97.9 F (36.6 C)-99 F (37.2 C)] 98 F (36.7 C) (01/21 0510) Pulse Rate:  [80-89] 80 (01/21 0510) Resp:  [9-16] 16 (01/21 0510) BP: (129-154)/(81-92) 129/81 mmHg (01/21 0510) SpO2:  [95 %-98 %] 95 % (01/21 0510)  Intake/Output from previous day: 01/20 0701 - 01/21 0700 In: 3175 [P.O.:600; I.V.:2575] Out: -  Intake/Output this shift:    No results found for this basename: HGB,  in the last 72 hours No results found for this basename: WBC, RBC, HCT, PLT,  in the last 72 hours No results found for this basename: NA, K, CL, CO2, BUN, CREATININE, GLUCOSE, CALCIUM,  in the last 72 hours No results found for this basename: LABPT, INR,  in the last 72 hours  ABD soft No cellulitis present Compartment soft  Patient has good refill no complicating features. Patient has his wife at bedside. I discussed with he and his wife all issues.  The patient overall is quite well and his exam shows no signs of infection or loss of vascular supply  Assessment/Plan: 1 Day Post-Op Procedure(s) (LRB): IRRIGATION AND DEBRIDMENT LEFT HAND AND BURYING RADIAL DIGITAL NERVE TO LEFT THUMB (Left) Plan for discharge tomorrow  Continue antibiotics until tomorrow at which time I likely DC home. Patient understands this and the do's and don'ts. We're pleased with his digit survival.  [name],[name] 10/30/2013, 1:09 PM

## 2013-10-30 NOTE — Anesthesia Postprocedure Evaluation (Signed)
  Anesthesia Post-op Note  Patient: [name]  Procedure(s) Performed: Procedure(s): IRRIGATION AND DEBRIDMENT LEFT HAND AND BURYING RADIAL DIGITAL NERVE TO LEFT THUMB (Left)  Patient Location: PACU  Anesthesia Type:General  Level of Consciousness: awake, alert  and oriented  Airway and Oxygen Therapy: Patient Spontanous Breathing and Patient connected to nasal cannula oxygen  Post-op Pain: mild  Post-op Assessment: Post-op Vital signs reviewed, Patient's Cardiovascular Status Stable, Respiratory Function Stable, Patent Airway and Pain level controlled  Post-op Vital Signs: stable  Complications: No apparent anesthesia complications

## 2013-10-30 NOTE — Op Note (Signed)
[name], [name]             ACCOUNT NO.:  [id number]  MEDICAL RECORD NO.:  [id number]  LOCATION:  5N31C                        FACILITY:  [facility]  PHYSICIAN:  Satira Anis. Norva Bowe, M.D.DATE OF BIRTH:  [birth year]  DATE OF PROCEDURE: DATE OF DISCHARGE:                              OPERATIVE REPORT   PREOPERATIVE DIAGNOSES:  Status post was crushing injury to the hand, status post revascularization and replantation of the thumb, left hand with associated soft tissue reconstruction and repair of mutilating injury to the hand.  The patient presents for washout/incision and drainage and burying radial digital nerve to the thumb.  POSTOPERATIVE DIAGNOSES:  Status post was crushing injury to the hand, status post revascularization and replantation of the thumb, left hand with associated soft tissue reconstruction and repair of mutilating injury to the hand.  The patient presents for washout/incision and drainage and burying radial digital nerve to the thumb.  PROCEDURES: 1. Irrigation and debridement of skin, subcutaneous tissue, bone,     tendon and muscle, left thumb. 2. Burying radial digital nerve, left thumb. 3. Exploration of palmar wound with rotation flap coverage of the     volar aspect of the palm.  SURGEON:  Satira Anis. Amedeo Plenty, M.D.  ASSISTANT:  Avelina Laine, P.A.-C.  COMPLICATION:  None.  ANESTHESIA:  General.  TOURNIQUET TIME:  Zero.  INDICATIONS:  The patient is pleasant 58 year old male who has undergone aggressive attempts at reconstruction of his hand.  He understands risks and benefits of surgery and desires to proceed.  All questions have been encouraged and answered preoperatively.  OPERATION IN DETAIL:  The patient was taken to the operative arena.  I made sure the room was warm, we used warm saline only.  We very carefully removed his dressing.  All areas looked viable.  Following this, we then prepped and draped him.  My assistant, Mr. Jenean Lindau,  [place]. I performed the prep and drape and handled the arm exclusively.  The patient underwent removal of prior tacking stitches and following this, we performed irrigation and debridement of skin, subcutaneous tissue, muscle, tendon, and bone.  This was an excisional debridement and nonviable tissue was removed.  We did not inflate the tourniquet. The refill to the thumb was excellent the whole time.  Following 4 liters of irrigation and debridement, we then performed a burying of the radial digital nerve to the thumb to prevent pain.  Following this, we then performed very careful and cautious closure of the wound.  This was loose closure with rotation flap-type coverage, swinging the palmar flap into the webspace of the first web to provide coverage.  I made sure that there was no excessive tension.  Following this, we allowed period of observation and all looked quite well.  All digits especially the thumb had excellent refill.  At this juncture, I did revise the pin cut and cut the pin with pin cutters.  I did this so that the pin would not be prominent.  We are going to try to leave this pin in for 4-6 weeks.  The patient tolerated this well.  He was dressed with Adaptic, Xeroform. There were no signs of infection, no signs of complicating features.  We are going to monitor his condition very closely.  We are going to stop his dextran, continue aspirin, place him on Cipro as well as an Ancef. I asked him to notify should any problems occur.  Otherwise, we are going to continue neurovascular precautions and possible discharge in 48 hours if things are looking well.  Do's and don'ts have been discussed, and all questions have been encouraged and answered.     Satira Anis. Amedeo Plenty, M.D.     [facility]  D:  10/29/2013  T:  10/30/2013  Job:  [id number]

## 2013-10-31 MED ORDER — CIPROFLOXACIN HCL 500 MG PO TABS: 500.0000 mg | ORAL_TABLET | Freq: Two times a day (BID) | ORAL | Status: DC

## 2013-10-31 MED ORDER — MAGNESIUM CITRATE PO SOLN
0.5000 | Freq: Once | ORAL | Status: AC
  Administered 2013-10-31: 0.5 via ORAL
  Filled 2013-10-31: qty 296

## 2013-10-31 MED ORDER — OXYCODONE HCL 5 MG PO TABS: 5.0000 mg | ORAL_TABLET | ORAL | Status: DC | PRN

## 2013-10-31 NOTE — Discharge Instructions (Signed)
..  Keep bandage clean and dry.  Call for any problems.  No smoking.  Criteria for driving a car: you should be off your pain medicine for 7-8 hours, able to drive one handed(confident), thinking clearly and feeling able in your judgement to drive. Continue elevation as it will decrease swelling.  If instructed by MD move your fingers within the confines of the bandage/splint.  Use ice if instructed by your MD. Call immediately for any sudden loss of feeling in your hand/arm or change in functional abilities of the extremity. .We recommend that you to take vitamin C 1000 mg a day to promote healing we also recommend that if you require her pain medicine that he take a stool softener to prevent constipation as most pain medicines will have constipation side effects. We recommend either Peri-Colace or Senokot and recommend that you also consider adding MiraLAX to prevent the constipation affects from pain medicine if you are required to use them. These medicines are over the counter and maybe purchased at a local pharmacy. Keep your hand warm and do not get it cold. Avoid chocolate, coffee, tea and any other drinks or beverages that contain caffeine. Do not remove dressings

## 2013-10-31 NOTE — Discharge Summary (Signed)
Physician Discharge Summary  Patient ID: [name] MRN: [id number] DOB/AGE: 58/22/1957 58 y.o.  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 The patient was seen emergently by hand surgery was taken to the operative suite where he underwent major revision of the hand and vascularization to the left thumb. He tolerated the procedure without difficulties and was admitted with reimplant precautions to include, keeping the room warm, avoiding of caffeine or nicotine, and anti-coagulation. He did quite well postoperatively and had no complicating features. He was taken back to the operative suite where he went a second I&D and closure as well dressing change.  Patient continued close observation and neurovascular checks frequently. Tolerated IV antibiotics without difficulties and it was progressed to oral Cipro for prophylactic purposes. Patient continually improve his pain was controlled. He tolerated a regular diet without difficulties. Did have some difficulties with bowel movements however after instituting stool softeners and suppositories he was successful, the patient was voiding without difficulty. He underwent a bedside dressing change as well as wound care. Thumb remained viable with excellent refill. The decision was made to discharge him discussed all discharge issues with him at length.  Consults: None   Treatments: See operative report  Discharge Exam: Blood pressure 138/84, pulse 81, temperature 98.3 F (36.8 C), temperature source Oral, resp. rate 18, height 5\' 5"   (1.651 m), weight 85 kg (187 lb 6.3 oz), SpO2 96.00%.  Physical examination .Marland KitchenThe patient is alert and oriented in no acute distress the patient complains of pain in the affected upper extremity.  The patient is noted to have a normal HEENT exam.  Lung fields show equal chest expansion and no shortness of breath  abdomen exam is nontender without distention.  Lower extremity examination does not show any fracture dislocation or blood clot symptoms.  Pelvis is stable neck and back are stable and nontender Evaluation of her left upper extremity shows that the thumb has excellent refill, index to small finger neurovascularly intact with some mild dysesthesias about the index, his wounds were copiously cleaned and redressed with a splint applied  Disposition: 01-Home or Self Care  Discharge Orders   Future Orders Complete By Expires   Call MD / Call 911  As directed    Comments:     If you experience chest pain or shortness of breath, CALL 911 and be transported to the hospital emergency room.  If you develope a fever above 101 F, pus (white drainage) or increased drainage or redness at the wound, or calf pain, call your surgeon's office.   Constipation Prevention  As directed    Comments:     Drink plenty of fluids.  Prune juice may be helpful.  You may use a stool softener, such as Colace (over the counter) 100 mg twice a day.  Use MiraLax (over the counter) for constipation as needed.   Diet - low sodium heart healthy  As directed    Discharge instructions  As directed    Comments:     Please keep your hand warm and do not apply ice to the upper extremity. Do not remove  dressings, it is okay to try to move your index through small finger with flexion and extension, however, do not try to bend the thumb. Keep dressings clean and dry and do not remove them.Marland KitchenMarland KitchenKeep bandage clean and dry.  Call for any problems.  No smoking.  Criteria for driving a car: you should be off your pain medicine for 7-8  hours, able to drive one handed(confident), thinking clearly and feeling able in your judgement to drive. Continue elevation as it will decrease swelling.  If instructed by MD move your fingers within the confines of the bandage/splint.  Use ice if instructed by your MD. Call immediately for any sudden loss of feeling in your hand/arm or change in functional abilities of the extremity. .We recommend that you to take vitamin C 1000 mg a day to promote healing we also recommend that if you require her pain medicine that he take a stool softener to prevent constipation as most pain medicines will have constipation side effects. We recommend either Peri-Colace or Senokot and recommend that you also consider adding MiraLAX to prevent the constipation affects from pain medicine if you are required to use them. These medicines are over the counter and maybe purchased at a local pharmacy.   Increase activity slowly as tolerated  As directed        Medication List         ciprofloxacin 500 MG tablet  Commonly known as:  CIPRO  Take 1 tablet (500 mg total) by mouth 2 (two) times daily.     oxyCODONE 5 MG immediate release tablet  Commonly known as:  Oxy IR/ROXICODONE  Take 1 tablet (5 mg total) by mouth every 3 (three) hours as needed for moderate pain.           Follow-up Information   Follow up with Paulene Floor, MD. Schedule an appointment as soon as possible for a visit in 7 days.   Specialty:  Orthopedic Surgery   Contact information:   [address] [place] [address] [phone]       Signed: Ivan Croft 10/31/2013, 5:54 PM

## 2013-11-01 NOTE — Progress Notes (Signed)
Patient discharge instructions reviewed with family. Family stated patient had all belongings locked in security. Prescriptions given. All was understood by family and patient. [place] in room gathered by family.

## 2013-11-01 NOTE — Progress Notes (Signed)
Patient ID: [name], male   DOB: 12/31/1955, 58 y.o.   MRN: [id number] Patient looks very well  Please see discharge summary.  Discharge diagnosis: status post reconstruction left hand to include ORIF thumb and revascularization/replantation thumb  We will see the patient at 11 AM Thursday next week  All questions have been answered  Roseanne Kaufman

## 2013-11-04 ENCOUNTER — Encounter (HOSPITAL_COMMUNITY): Payer: Self-pay | Admitting: Orthopedic Surgery

## 2016-02-07 ENCOUNTER — Emergency Department (HOSPITAL_COMMUNITY)
Admission: EM | Admit: 2016-02-07 | Discharge: 2016-02-07 | Disposition: A | Discharge status: Home / Self Care | Payer: Self-pay | Attending: Emergency Medicine | Admitting: Emergency Medicine

## 2016-02-07 ENCOUNTER — Encounter (HOSPITAL_COMMUNITY): Payer: Self-pay | Admitting: *Deleted

## 2016-02-07 DIAGNOSIS — Z202 Contact with and (suspected) exposure to infections with a predominantly sexual mode of transmission: Secondary | ICD-10-CM

## 2016-02-07 DIAGNOSIS — Z792 Long term (current) use of antibiotics: Secondary | ICD-10-CM | POA: Insufficient documentation

## 2016-02-07 DIAGNOSIS — Z8639 Personal history of other endocrine, nutritional and metabolic disease: Secondary | ICD-10-CM | POA: Insufficient documentation

## 2016-02-07 DIAGNOSIS — N4889 Other specified disorders of penis: Secondary | ICD-10-CM | POA: Insufficient documentation

## 2016-02-07 DIAGNOSIS — Z87891 Personal history of nicotine dependence: Secondary | ICD-10-CM | POA: Insufficient documentation

## 2016-02-07 DIAGNOSIS — I10 Essential (primary) hypertension: Secondary | ICD-10-CM | POA: Insufficient documentation

## 2016-02-07 DIAGNOSIS — Z8659 Personal history of other mental and behavioral disorders: Secondary | ICD-10-CM | POA: Insufficient documentation

## 2016-02-07 DIAGNOSIS — R21 Rash and other nonspecific skin eruption: Secondary | ICD-10-CM

## 2016-02-07 LAB — URINALYSIS, ROUTINE W REFLEX MICROSCOPIC
Bilirubin Urine: NEGATIVE
Glucose, UA: NEGATIVE mg/dL
Ketones, ur: NEGATIVE mg/dL
Leukocytes, UA: NEGATIVE
Nitrite: NEGATIVE
Protein, ur: NEGATIVE mg/dL
Specific Gravity, Urine: 1.016 (ref 1.005–1.030)
pH: 5.5 (ref 5.0–8.0)

## 2016-02-07 LAB — URINE MICROSCOPIC-ADD ON
Bacteria, UA: NONE SEEN
Squamous Epithelial / HPF: NONE SEEN

## 2016-02-07 MED ORDER — AZITHROMYCIN 250 MG PO TABS
1000.0000 mg | ORAL_TABLET | Freq: Once | ORAL | Status: AC
  Administered 2016-02-07: 1000 mg via ORAL
  Filled 2016-02-07: qty 4

## 2016-02-07 MED ORDER — CEFTRIAXONE SODIUM 250 MG IJ SOLR
250.0000 mg | Freq: Once | INTRAMUSCULAR | Status: AC
  Administered 2016-02-07: 250 mg via INTRAMUSCULAR
  Filled 2016-02-07: qty 250

## 2016-02-07 MED ORDER — LIDOCAINE HCL (PF) 1 % IJ SOLN
INTRAMUSCULAR | Status: AC
  Filled 2016-02-07: qty 5

## 2016-02-07 NOTE — ED Notes (Signed)
Used interpretor phones: Pt reports recent sexual relations and now has penile irritation and pain with urination.

## 2016-02-07 NOTE — Discharge Instructions (Signed)
Enfermedades de transmisin sexual (Sexually Transmitted Disease) Una enfermedad de transmisin sexual (ETS) es toda infeccin que se contagia (transmite) de Ardelia Mems persona a otra durante la actividad sexual. Puede transmitirse a travs de la saliva, el semen, la [place], el moco vaginal o la [place]. Las enfermedades de transmisin sexual ms frecuentes son:   Roderick Pee.  Clamidia.  Sfilis.  VIH y [place].  Herpes genital.  Hepatitis B y C.  Tricomonas.  Virus del papiloma humano (VPH).  [place].  Escabiosis.  [place].  Vaginosis bacteriana. CULES SON LAS CAUSAS DE LAS ETS? Una ETS se transmite por bacterias, virus o parsitos. Las ETS se contagian durante la actividad sexual si una de las personas est infectada. Sin embargo, tambin puede trasmitirse por medios no sexuales. Las ETS se trasmiten despus de:   Tener contacto sexual con un compaero infectado.  Compartir juguetes sexuales.  Compartir agujas con una persona infectada o intercambiar piercings o agujas de tatuajes.  Tener un contacto ntimo con los genitales, la boca, o la zona anal de una persona infectada.  Exposicin a los fluidos infectados durante el nacimiento. CULES SON LOS SIGNOS Y SNTOMAS DE UNA INFECCIN POR ETS? Las distintas enfermedades de transmisin sexual tienen diferentes sntomas. Algunas personas no tienen sntomas. Si se presentan sntomas, estos pueden ser:   Miccin dolorosa o con sangre.  Dolor en la pelvis, el abdomen, la vagina, el ano, la garganta o los ojos.  Erupcin cutnea, picazn o irritacin.  Bultos, lceras, ampollas o llagas en la zona genital o anal.  Flujo vaginal anormal con o sin mal olor.  En los hombres, secrecin peniana.  Cristy Hilts.  Dolor o Runner, broadcasting/film/video.  Inflamacin de los ganglios en la zona de la ingle.  Piel y ojos amarillos (ictericia). Esto se observa con la hepatitis.  Hinchazn de los  testculos.  Infertilidad.  Llagas y ampollas en la boca. CMO SE DIAGNOSTICAN LAS ETS? Para realizar un diagnstico, el mdico:   Psychologist, sport and exercise.  Realizar un examen fsico.  Alyse Low de cualquier secrecin que presente para analizarla.  Tomar una muestra de la garganta, el cuello del tero, la abertura del pene, el recto o la vagina para el [place].  Analizar una muestra de la primera orina de la [place].  Le pedir [employer].  Si corresponde, le tomar la [place] para un [place].  Har una colposcopa.  Har una laparoscopa. CMO SE TRATAN LAS ETS? El tratamiento depende de cul sea la ETS. Algunas ETS pueden tratarse pero no tienen [place].   Clamidia, gonorrea, tricomonas y sfilis pueden curarse con antibiticos.  El herpes genital, la hepatitis y el VIH pueden tratarse, pero no curarse, con medicamentos recetados. Los [employer].  Las Librarian, academic VPH se extirpan utilizando medicamentos o mediante el congelamiento, el quemado (Audiological scientist) o la Leisure centre manager. Las Training and development officer.  El VPH no puede curarse con medicamentos o [place]. Sin embargo pueden extirparse zonas anormales del cuello del tero, la vagina o la vulva.  Si el diagnstico se confirma, sus compaeros sexuales ms recientes necesitan recibir tratamiento. Deben realizarlo aunque no tengan problemas o sus cultivos o evaluaciones sean negativos. No deben tener relaciones sexuales hasta que sus mdicos los autoricen.  El mdico puede repetirle las pruebas de deteccin de infecciones 3 meses despus del [place]. CMO PUEDO REDUCIR EL RIESGO DE TENER UNA ETS? Estas acciones le ayudarn a reducir el riesgo de contraer una ETS:  Use condones de  ltex, protectores bucales y lubricantes solubles en agua durante la actividad sexual.No utilice vaselina ni aceites.  Evite tener mltiples compaeros sexuales.  No  tenga relaciones sexuales con alguien que tiene otros [employer].  No tenga relaciones sexuales con quien no conozca o con quien tenga riesgo de sufrir una ETS.  Evite las prcticas sexuales riesgosas que puedan lacerar la piel.  No tenga relaciones sexuales si tiene llagas abiertas en la boca o piel.  Evite abusar del alcohol o consumir drogas ilegales. El consumo de drogas o alcohol, puede afectar su capacidad de juicio y colocarlo en una posicin vulnerable.  Evite el sexo oral o anal.  Aplquese las vacunas para el VPH y la hepatitis. Si no ha recibido Biomedical engineer, consulte a su mdico si debe aplicarse una o ambas.  Si tiene riesgo de infectarse por el VIH, se recomienda tomar diariamente un medicamento recetado para evitar la infeccin. Esto se conoce como profilaxis previa a la exposicin. Se considera que est en riesgo si:  Es un hombre que tiene sexo con otros hombres.  Es heterosexual y es activo sexualmente con ms de una pareja.  Se inyecta drogas.  Es [place] sexualmente con Ardelia Mems pareja que tiene VIH.  Consulte a su mdico para saber si tiene un alto riesgo de infectarse por el VIH. Si opta por comenzar la profilaxis previa a la exposicin, primero debe realizarse anlisis de deteccin del VIH. Luego, le harn anlisis cada 9meses mientras est tomando los medicamentos para la profilaxis previa a la exposicin. QU DEBO HACER SI CREO QUE TENGO UNA ETS?  Consulte a su mdico.  Hable con su pareja sexual. Ellos deben ser evaluados y recibir tratamiento.  No tenga relaciones sexuales hasta que el mdico lo autorice. [place]? Comunquese con su mdico de inmediato si:   Siente un dolor abdominal intenso.  Usted es hombre y nota hinchazn o dolor en los testculos.  Usted es mujer y nota hinchazn o dolor en la vagina.   Esta informacin no tiene Marine scientist el consejo del mdico. Asegrese de hacerle al  mdico cualquier pregunta que tenga.  You still have gonorrhea, chlamydia and syphilis and HIV results that are pending. These results will not come back for another 2-3 days. You will be notified if these results come back positive or if you need any further treatment. Recommend avoid sexual intercourse or barrier contraception usage until results come back to avoid reinfection. If your results to come back positive you'll need to notify all sexual partners that they may be treated as well. Return to the emergency department a few experience severe worsening of your symptoms, fever, chills, penile discharge, abdominal pain, vomiting.

## 2016-02-07 NOTE — ED Provider Notes (Signed)
CSN: [id number]     Arrival date & time 02/07/16  1259 History   First MD Initiated Contact with Patient 02/07/16 1337     Chief Complaint  Patient presents with  . Exposure to STD     (Consider location/radiation/quality/duration/timing/severity/associated sxs/prior Treatment) HPI   [name] is a 60 year old male with a past medical history of HTN, HLD presents the ED today complaining of irritation. He states that he had unprotected sex partner 3 days ago and since then he has had a area of redness and irritation on the top of his penis. He also reports significant dysuria. He denies any fevers, chills, penile discharge, testicular pain, abdominal pain, vomiting.   Past Medical History  Diagnosis Date  . Depression   . Thyroid disease   . ED (erectile dysfunction)   . Hypertension   . Hyperlipidemia    Past Surgical History  Procedure Laterality Date  . Hernia repair    . Inguinal hernia repair  10/23/2012    Procedure: LAPAROSCOPIC INGUINAL HERNIA;  Surgeon: Ralene Ok, MD;  Location: [place];  Service: General;  Laterality: Left;  . Insertion of mesh  10/23/2012    Procedure: INSERTION OF MESH;  Surgeon: Ralene Ok, MD;  Location: [place];  Service: General;  Laterality: Left;  . I&d extremity Left 10/26/2013    Procedure: IRRIGATION AND DEBRIDEMENT EXTREMITY AND REPAIR AS NECESSARY;  Surgeon: Roseanne Kaufman, MD;  Location: [place];  Service: Orthopedics;  Laterality: Left;  . Nerve, tendon and artery repair Left 10/29/2013    Procedure: IRRIGATION AND DEBRIDMENT LEFT HAND AND BURYING RADIAL DIGITAL NERVE TO LEFT THUMB;  Surgeon: Roseanne Kaufman, MD;  Location: [place];  Service: Orthopedics;  Laterality: Left;   Family History  Problem Relation Age of Onset  . Heart disease      No family history    Social History  Substance Use Topics  . Smoking status: Former Smoker -- 2.00 packs/day for 16 years    Quit date: 10/10/1992  . Smokeless tobacco: None  . Alcohol  Use: Yes     Comment: reports glass of wine once a month    Review of Systems  All other systems reviewed and are negative.     Allergies  Review of patient's allergies indicates no known allergies.  Home Medications   Prior to Admission medications   Medication Sig Start Date End Date Taking? Authorizing Provider  ciprofloxacin (CIPRO) 500 MG tablet Take 1 tablet (500 mg total) by mouth 2 (two) times daily. 10/31/13   Avelina Laine, PA-C  oxyCODONE (OXY IR/ROXICODONE) 5 MG immediate release tablet Take 1 tablet (5 mg total) by mouth every 3 (three) hours as needed for moderate pain. 10/31/13   Avelina Laine, PA-C   BP 104/71 mmHg  Pulse 70  Temp(Src) 98.3 F (36.8 C) (Oral)  Resp 16  SpO2 96% Physical Exam  Constitutional: He is oriented to person, place, and time. He appears well-developed and well-nourished. No distress.  HENT:  Head: Normocephalic and atraumatic.  Eyes: Conjunctivae are normal. Right eye exhibits no discharge. Left eye exhibits no discharge. No scleral icterus.  Cardiovascular: Normal rate.   Pulmonary/Chest: Effort normal.  Genitourinary:  1cm area of redness and small ulceration to dorsal aspect of glans. No testicular pain or swelling.   Neurological: He is alert and oriented to person, place, and time. Coordination normal.  Skin: Skin is warm and dry. No rash noted. He is not diaphoretic. No erythema. No pallor.  Psychiatric:  He has a normal mood and affect. His behavior is normal.  Nursing note and vitals reviewed.   ED Course  Procedures (including critical care time) Labs Review Labs Reviewed  URINE CULTURE  RPR  HIV ANTIBODY (ROUTINE TESTING)  URINALYSIS, ROUTINE W REFLEX MICROSCOPIC (NOT AT [facility])  GC/CHLAMYDIA PROBE AMP ([place]) NOT AT [facility]    Imaging Review No results found. I have personally reviewed and evaluated these images and lab results as part of my medical decision-making.   EKG Interpretation None      MDM    Final diagnoses:  STD exposure  Penile rash    Patient to be discharged with instructions to follow up with PCP. Discussed importance of using protection when sexually active. Pt understands that they have GC/Chlamydia cutures and HIV and RPR pending and that they will need to inform all sexual partners if results return positive. Pt has been treated prophylacticly with azithromycin and rocephin. UA negative for infection.      Dondra Spry [place], PA-C 02/07/16 1936  Ezequiel Essex, MD 02/07/16 2229

## 2016-02-08 LAB — SYPHILIS: RPR W/REFLEX TO RPR TITER AND TREPONEMAL ANTIBODIES, TRADITIONAL SCREENING AND DIAGNOSIS ALGORITHM: RPR Ser Ql: NONREACTIVE

## 2016-02-08 LAB — GC/CHLAMYDIA PROBE AMP (~~LOC~~) NOT AT ARMC
Chlamydia: NEGATIVE
Neisseria Gonorrhea: NEGATIVE

## 2016-02-08 LAB — HIV ANTIBODY (ROUTINE TESTING W REFLEX): HIV Screen 4th Generation wRfx: NONREACTIVE

## 2016-02-09 LAB — URINE CULTURE: Culture: NO GROWTH

## 2018-05-29 ENCOUNTER — Encounter: Payer: Self-pay | Admitting: Pediatric Intensive Care

## 2018-05-29 NOTE — Congregational Nurse Program (Signed)
Client in clinic for [facility] sign up. Via interpreter Brenda-States history of urinary frequency, lower abdominal pain, burning while voiding bladder. He states that he voids his bladder 20-30 times per day. He feels like he is never able to empty his bladder. CN referred client to [facility]. Directions to clinic and appointment information given.

## 2018-06-14 ENCOUNTER — Ambulatory Visit: Payer: Self-pay | Admitting: Family Medicine

## 2018-06-14 ENCOUNTER — Ambulatory Visit: Payer: Self-pay | Attending: Family Medicine | Admitting: Physician Assistant

## 2018-06-14 VITALS — BP 139/80 | HR 82 | Temp 98.3°F | Resp 18 | Ht 65.0 in | Wt 194.0 lb

## 2018-06-14 DIAGNOSIS — N410 Acute prostatitis: Secondary | ICD-10-CM

## 2018-06-14 DIAGNOSIS — R300 Dysuria: Secondary | ICD-10-CM | POA: Insufficient documentation

## 2018-06-14 DIAGNOSIS — R350 Frequency of micturition: Secondary | ICD-10-CM | POA: Insufficient documentation

## 2018-06-14 DIAGNOSIS — R319 Hematuria, unspecified: Secondary | ICD-10-CM | POA: Insufficient documentation

## 2018-06-14 DIAGNOSIS — R5383 Other fatigue: Secondary | ICD-10-CM | POA: Insufficient documentation

## 2018-06-14 DIAGNOSIS — M545 Low back pain: Secondary | ICD-10-CM | POA: Insufficient documentation

## 2018-06-14 DIAGNOSIS — R3911 Hesitancy of micturition: Secondary | ICD-10-CM | POA: Insufficient documentation

## 2018-06-14 DIAGNOSIS — Z87448 Personal history of other diseases of urinary system: Secondary | ICD-10-CM | POA: Insufficient documentation

## 2018-06-14 LAB — POCT URINALYSIS DIP (CLINITEK)
BILIRUBIN UA: NEGATIVE mg/dL
Bilirubin, UA: NEGATIVE
Glucose, UA: NEGATIVE mg/dL
Leukocytes, UA: NEGATIVE
NITRITE UA: NEGATIVE
PH UA: 6 (ref 5.0–8.0)
Spec Grav, UA: 1.02 (ref 1.010–1.025)
Urobilinogen, UA: 0.2 E.U./dL

## 2018-06-14 MED ORDER — CIPROFLOXACIN HCL 500 MG PO TABS: 500.0000 mg | ORAL_TABLET | Freq: Two times a day (BID) | ORAL | 0 refills | Status: DC

## 2018-06-14 MED ORDER — TAMSULOSIN HCL 0.4 MG PO CAPS: 0.4000 mg | ORAL_CAPSULE | Freq: Every day | ORAL | 3 refills | Status: DC

## 2018-06-14 NOTE — Patient Instructions (Signed)
Hiperplasia prosttica benigna Benign Prostatic Hyperplasia La hiperplasia prosttica benigna (HBP) es un aumento del tamao de la prstata que es causado por el proceso de envejecimiento normal y no por Science writer. La prstata es una glndula del tamao de una nuez que participa en la produccin de semen. Est ubicada frente al recto y debajo de la vejiga. La vejiga almacena orina, y la uretra es el tubo que transporta la orina desde la vejiga hasta el exterior del cuerpo. La prstata puede aumentar de tamao a medida que un hombre envejece. Una prstata agrandada puede presionar la uretra. Esto puede dificultar el pasaje de la orina. La acumulacin de orina en la vejiga puede causar una infeccin. La presin y la infeccin pueden provocar daos en la vejiga y una insuficiencia en los riones (renal). Cules son las causas? Esta afeccin es una parte normal del proceso de envejecimiento. Sin embargo, no todos los hombres desarrollan problemas por esta afeccin. Si la prstata se agranda lejos de la uretra, el flujo de orina no se obstruir. Si se agranda hacia la uretra y la comprime, habr problemas con el paso de la [place]. Qu incrementa el riesgo? Es ms probable que esta afeccin se manifieste en hombres mayores de [id number]. Cules son los signos o los sntomas? Los sntomas de esta afeccin incluyen los siguientes:  Levantarse con frecuencia durante la noche para Garment/textile technologist.  Necesidad de Garment/textile technologist con ms frecuencia Agricultural consultant.  Dificultad para comenzar a eliminar la orina.  Disminucin del tamao y de la fuerza del chorro de [place].  Prdida (goteo) luego de Garment/textile technologist.  Imposibilidad para orinar. En este caso, es necesario un tratamiento inmediato.  Imposibilidad para vaciar la vejiga completamente.  Dolor al [place]. Esto es ms comn si tambin hay una infeccin.  Infeccin urinaria (IU).  Cmo se diagnostica? Esta afeccin se diagnostica en funcin de los antecedentes mdicos, un  examen fsico y los sntomas. Tambin se le realizarn estudios, por ejemplo:  Un estudio despus de vaciar la vejiga. Este mide la cantidad de orina que queda en la vejiga despus de terminar de Garment/textile technologist.  Examen rectal digital. En un examen rectal, el mdico controla la prstata colocando un dedo lubricado y enguantado en el recto para sentir la parte posterior de la prstata. Este examen detecta el tamao de la glndula y si hay algn bulto o crecimiento anormal.  Anlisis de orina (urinlisis).  Estudio de antgeno prosttico especfico (PSA). Este es un anlisis de sangre que se [place] para Electrical engineer de prstata.  Una ecografa. En [employer], se utilizan ondas de sonido para producir de Publishing copy una imagen de la glndula prosttica.  El mdico puede derivarlo a Teaching laboratory technician en enfermedades del rin y de la prstata (urlogo). Cmo se trata? Una vez que los sntomas comienzan, el mdico controlar la afeccin (vigilancia activa u observacin cautelosa). El tratamiento depender de la gravedad de la afeccin. El tratamiento puede incluir lo siguiente:  Observacin y exmenes anuales. Este puede ser el nico tratamiento necesario si la afeccin y los sntomas son leves.  Medicamentos para [employer], entre los que se incluyen los siguientes: ? Medicamentos para Contractor. ? Medicamentos para relajar el msculo de la prstata.  Dwaine Gale, solo [employer] son graves. La ciruga puede incluir lo siguiente: ? Prostatectoma. En este procedimiento, se extrae el tejido de la prstata completamente a travs de una incisin abierta, con un laparoscopio o con robtica. ? Reseccin transuretral de la prstata (  RTUP). En este procedimiento, se inserta una herramienta a travs de la abertura en la punta del pene (uretra). Se utiliza para cortar tejido del centro interior de la prstata. Los trozos se retiran a Designer, jewellery de la misma abertura del pene.  De este modo, se libera la obstruccin. ? Incisin transuretral (ITUP). En este procedimiento, se hacen pequeos cortes en la prstata. Esto alivia la presin de la prstata sobre la uretra. ? Termoterapia transuretral con microondas (TTUM). En este procedimiento, se utilizan microondas para Special educational needs teacher. El calor destruye y extirpa Ardelia Mems pequea cantidad de tejido prosttico. ? Ablacin transuretral con aguja (ATUA). En este procedimiento, se utiliza la radiofrecuencia para destruir y extirpar una pequea cantidad de tejido prosttico. ? Coagulacin intersticial con lser (CIL). En este procedimiento, se utiliza un lser para destruir y extirpar una pequea cantidad de tejido prosttico. ? Electrovaporizacin transuretral (EVTU). En este procedimiento, se utilizan electrodos para destruir y extirpar una pequea cantidad de tejido prosttico. ? Liberacin uretral prosttica. En este procedimiento, se inserta un implante para ejercer presin en los lbulos de la prstata, en direccin contraria a la uretra.  Siga estas indicaciones en su casa:  Tome los medicamentos de venta libre y los recetados solamente como se lo haya indicado el mdico.  Controle si hay cambios en los sntomas. Hable con su mdico antes de hacer cualquier cambio.  Evite beber grandes cantidades de lquido antes de irse a la cama o de salir de su casa.  Evite o reduzca la cantidad de cafena o alcohol que consume.  Tmese tiempo para orinar.  Concurra a todas las visitas de seguimiento como se lo haya indicado el mdico. Esto es importante. Comunquese con un mdico si:  Siente dolor en la espalda sin explicacin.  Los sntomas no mejoran con Dispensing optician.  Los [employer] causan efectos secundarios.  La orina se vuelve muy oscura o tiene mal olor.  Siente que la parte inferior del abdomen est distendida y tiene problemas para Garment/textile technologist. Solicite ayuda de inmediato si:  Tiene fiebre o siente escalofros.  De  repente no [employer].  Se siente mareado, ligeramente aturdido o se desmaya.  Observa gran cantidad de sangre o cogulos sanguneos en la orina.  Sus problemas urinarios se vuelven difciles de Chief Technology Officer.  Siente dolor moderado a intenso en la espalda o en la fosa lumbar. La fosa lumbar es el costado del cuerpo entre las costillas y la cadera. Estos sntomas pueden representar un problema grave que constituye Engineer, maintenance (IT). No espere hasta que los sntomas desaparezcan. Solicite atencin mdica de inmediato. Comunquese con el servicio de emergencias de su localidad (911 en los [place]). No conduzca por sus propios medios [employer] hospital. Resumen  La hiperplasia prosttica benigna (HBP) es un aumento del tamao de la prstata que es causado por el proceso de envejecimiento normal y no por Science writer.  Una prstata agrandada puede presionar la uretra. Esto puede dificultar el paso de la [place].  Esta afeccin es parte del proceso de envejecimiento normal y es ms probable que se manifieste en hombres mayores de 98PJA.  Obtenga atencin de inmediato si de repente no puede orinar. Esta informacin no tiene Marine scientist el consejo del mdico. Asegrese de hacerle al mdico cualquier pregunta que tenga. Document Released: 09/26/2005 Document Revised: 01/16/2017 Document Reviewed: 01/16/2017 Elsevier Interactive Patient Education  2018 [employer].

## 2018-06-14 NOTE — Progress Notes (Signed)
Patient ID: [name], male   DOB: May 09, 1956, 62 y.o.   MRN: [id number]      [name], is a 62 y.o. male  OYD:[id number]  [id number]:[id number]  DOB - [birth year]  Subjective:  Chief Complaint and HPI: [name] is a 62 y.o. male here today to establish care and for worsening 3 month h/o on and off urinary frequency, nocturia, urinary hesitancy.  Occasional dysuria.  Sometimes has back pain.  No N/V.  No fever.  He does have a history of prostatitis about 4 years ago.  Sometimes unable to urinate until after he has a BM.  No changes in BMs.  No melena.  No hematochezia.  He does have lower abdominal and pelvic pain on and off.  No change in appetite.    [employer] interpreters "Christy Sartorius" translating.   ED/Hospital notes reviewed.   Social History:  Denies STD risk factors.     ROS:   Constitutional:  No f/c, No night sweats, No unexplained weight loss. EENT:  No vision changes, No blurry vision, No hearing changes. No mouth, throat, or ear problems.  Respiratory: No cough, No SOB Cardiac: No CP, no palpitations GI:  + abd pain, No N/V/D. GU: +Urinary s/sx Musculoskeletal: +back pain Neuro: No headache, no dizziness, no motor weakness.  Skin: No rash Endocrine:  No polydipsia. No polyuria.  Psych: Denies SI/HI  No problems updated.  ALLERGIES: No Known Allergies  PAST MEDICAL HISTORY: Past Medical History:  Diagnosis Date  . Depression   . ED (erectile dysfunction)   . Hyperlipidemia   . Hypertension   . Thyroid disease     MEDICATIONS AT HOME: Prior to Admission medications   Medication Sig Start Date End Date Taking? Authorizing Provider  ciprofloxacin (CIPRO) 500 MG tablet Take 1 tablet (500 mg total) by mouth 2 (two) times daily. 06/14/18   Argentina Donovan, PA-C  tamsulosin (FLOMAX) 0.4 MG CAPS capsule Take 1 capsule (0.4 mg total) by mouth daily. 06/14/18   Argentina Donovan, PA-C     Objective:  EXAM:   Vitals:   06/14/18 1353  BP: 139/80    Pulse: 82  Resp: 18  Temp: 98.3 F (36.8 C)  TempSrc: Oral  SpO2: 100%  Weight: 194 lb (88 kg)  Height: 5\' 5"  (1.651 m)    General appearance : A&OX3. NAD. Non-toxic-appearing HEENT: Atraumatic and Normocephalic.  PERRLA. EOM intact.  TM clear B. Mouth-MMM, post pharynx WNL w/o erythema, No PND. Neck: supple, no JVD. No cervical lymphadenopathy. No thyromegaly Chest/Lungs:  Breathing-non-labored, Good air entry bilaterally, breath sounds normal without rales, rhonchi, or wheezing  CVS: S1 S2 regular, no murmurs, gallops, rubs  Abdomen: Bowel sounds present, Non tender and not distended with no gaurding, rigidity or rebound. Extremities: Bilateral Lower Ext shows no edema, both legs are warm to touch with = pulse throughout Neurology:  CN II-XII grossly intact, Non focal.   Psych:  TP linear. J/I WNL. Normal speech. Appropriate eye contact and affect.  Skin:  No Rash  Data Review Lab Results  Component Value Date   HGBA1C  02/24/2011    5.0 (NOTE)  According to the ADA Clinical Practice Recommendations for 2011, when HbA1c is used as a screening test:   >=6.5%   Diagnostic of Diabetes Mellitus           (if abnormal result  is confirmed)  5.7-6.4%   Increased risk of developing Diabetes Mellitus  References:Diagnosis and Classification of Diabetes Mellitus,Diabetes MBEM,7544,92(EFEOF 1):S62-S69 and Standards of Medical Care in         Diabetes - 2011,Diabetes HQRF,7588,32  (Suppl 1):S11-S61.     Assessment & Plan   1. Urinary frequency - POCT URINALYSIS DIP (CLINITEK)  2. Urinary hesitancy - Urine cytology ancillary only - Urine Culture - PSA - Comprehensive metabolic panel - Ambulatory referral to Urology - tamsulosin (FLOMAX) 0.4 MG CAPS capsule; Take 1 capsule (0.4 mg total) by mouth daily.  Dispense: 30 capsule; Refill: 3  3. Low back pain, unspecified back pain laterality, unspecified chronicity,  with sciatica presence unspecified - Comprehensive metabolic panel - Ambulatory referral to Urology  4. Fatigue, unspecified type - Comprehensive metabolic panel  5. Hematuria, unspecified type - Ambulatory referral to Urology  6. Acute prostatitis Will cover due to history. - ciprofloxacin (CIPRO) 500 MG tablet; Take 1 tablet (500 mg total) by mouth 2 (two) times daily.  Dispense: 28 tablet; Refill: 0     Patient have been counseled extensively about nutrition and exercise  Return in about 1 month (around 07/14/2018) for assign PCP; f/up urinary and sleep issues.  The patient was given clear instructions to go to ER or return to medical center if symptoms don't improve, worsen or new problems develop. The patient verbalized understanding. The patient was told to call to get lab results if they haven't heard anything in the next week.     Freeman Caldron, PA-C [facility] and [place], Cath[place]   06/14/2018, 2:05 PM

## 2018-06-15 LAB — COMPREHENSIVE METABOLIC PANEL
ALBUMIN: 4.5 g/dL (ref 3.6–4.8)
ALT: 24 IU/L (ref 0–44)
AST: 20 IU/L (ref 0–40)
Albumin/Globulin Ratio: 1.8 (ref 1.2–2.2)
Alkaline Phosphatase: 72 IU/L (ref 39–117)
BILIRUBIN TOTAL: 0.7 mg/dL (ref 0.0–1.2)
BUN / CREAT RATIO: 14 (ref 10–24)
BUN: 11 mg/dL (ref 8–27)
CO2: 25 mmol/L (ref 20–29)
Calcium: 9.1 mg/dL (ref 8.6–10.2)
Chloride: 103 mmol/L (ref 96–106)
Creatinine, Ser: 0.79 mg/dL (ref 0.76–1.27)
GFR calc Af Amer: 112 mL/min/{1.73_m2} (ref >59)
GFR calc non Af Amer: 97 mL/min/{1.73_m2} (ref >59)
Globulin, Total: 2.5 g/dL (ref 1.5–4.5)
Glucose: 76 mg/dL (ref 65–99)
POTASSIUM: 4.2 mmol/L (ref 3.5–5.2)
Sodium: 142 mmol/L (ref 134–144)
Total Protein: 7 g/dL (ref 6.0–8.5)

## 2018-06-15 LAB — URINE CYTOLOGY ANCILLARY ONLY
CHLAMYDIA, DNA PROBE: NEGATIVE
Neisseria Gonorrhea: NEGATIVE
TRICH (WINDOWPATH): NEGATIVE

## 2018-06-15 LAB — PSA: Prostate Specific Ag, Serum: 0.1 ng/mL (ref 0.0–4.0)

## 2018-06-16 LAB — URINE CULTURE: Organism ID, Bacteria: NO GROWTH

## 2018-06-19 ENCOUNTER — Telehealth: Payer: Self-pay | Admitting: *Deleted

## 2018-06-19 NOTE — Telephone Encounter (Signed)
-----   Message from Argentina Donovan, [place] sent at 06/16/2018  6:38 AM EDT ----- Please call patient.  Tell him that his prostate blood test and his urine culture were all negative for infection.  He should stop the Cipro.  He can continue the other medication(flomax) as it may help reduce symptoms of enlarged prostate.  I am still going to have him see the urologist to be evaluated for enlarged prostate.  foll0w-up as planned.  His kidney  And liver function and blood sugar were normal. Thanks, Freeman Caldron, PA-C

## 2018-06-19 NOTE — Telephone Encounter (Signed)
Medical Assistant used [employer] to contact patient.  Interpreter Name: Bethann Goo #: [id number] Patient was not available, [employer] left patient a voicemail. Voicemail states to give a call back to [place] with [facility] at [phone]. !!!Please inform patient of prostate and infection checks were negative. Patient should STOP Cipro and continue Flomax to help to reduce symptoms. Patient still needs to see the urologist and be evaluated. Patient will follow up here as planned!!!

## 2018-06-28 ENCOUNTER — Ambulatory Visit: Payer: Self-pay | Attending: Family Medicine | Admitting: Family Medicine

## 2018-06-28 ENCOUNTER — Encounter: Payer: Self-pay | Admitting: Family Medicine

## 2018-06-28 ENCOUNTER — Other Ambulatory Visit: Payer: Self-pay

## 2018-06-28 VITALS — BP 131/81 | HR 75 | Temp 98.3°F | Resp 18 | Ht 64.0 in | Wt 195.8 lb

## 2018-06-28 DIAGNOSIS — N410 Acute prostatitis: Secondary | ICD-10-CM

## 2018-06-28 DIAGNOSIS — I10 Essential (primary) hypertension: Secondary | ICD-10-CM | POA: Insufficient documentation

## 2018-06-28 DIAGNOSIS — Z9889 Other specified postprocedural states: Secondary | ICD-10-CM | POA: Insufficient documentation

## 2018-06-28 DIAGNOSIS — R5383 Other fatigue: Secondary | ICD-10-CM

## 2018-06-28 DIAGNOSIS — K4090 Unilateral inguinal hernia, without obstruction or gangrene, not specified as recurrent: Secondary | ICD-10-CM

## 2018-06-28 DIAGNOSIS — R350 Frequency of micturition: Secondary | ICD-10-CM

## 2018-06-28 DIAGNOSIS — Z8249 Family history of ischemic heart disease and other diseases of the circulatory system: Secondary | ICD-10-CM | POA: Insufficient documentation

## 2018-06-28 DIAGNOSIS — Z87891 Personal history of nicotine dependence: Secondary | ICD-10-CM | POA: Insufficient documentation

## 2018-06-28 DIAGNOSIS — R319 Hematuria, unspecified: Secondary | ICD-10-CM

## 2018-06-28 DIAGNOSIS — R42 Dizziness and giddiness: Secondary | ICD-10-CM

## 2018-06-28 DIAGNOSIS — K429 Umbilical hernia without obstruction or gangrene: Secondary | ICD-10-CM

## 2018-06-28 DIAGNOSIS — E785 Hyperlipidemia, unspecified: Secondary | ICD-10-CM | POA: Insufficient documentation

## 2018-06-28 LAB — POCT URINALYSIS DIP (CLINITEK)
Bilirubin, UA: NEGATIVE
Glucose, UA: NEGATIVE mg/dL
Ketones, POC UA: NEGATIVE mg/dL
Leukocytes, UA: NEGATIVE
Nitrite, UA: NEGATIVE
Spec Grav, UA: >=1.03 — AB
Urobilinogen, UA: 0.2 U/dL
pH, UA: 5

## 2018-06-28 MED ORDER — SULFAMETHOXAZOLE-TRIMETHOPRIM 800-160 MG PO TABS: 1.0000 | ORAL_TABLET | Freq: Two times a day (BID) | ORAL | 1 refills | Status: AC

## 2018-06-28 NOTE — Progress Notes (Signed)
Flu shot, yes Pain 8 penis

## 2018-06-28 NOTE — Progress Notes (Signed)
Subjective:    Patient ID: [name], male    DOB: May 04, 1956, 62 y.o.   MRN: [id number]  Due to a language barrier, video interpreter was used at today's visit  HPI 62 year old male who was recently seen in the clinic on 06/14/2018 by another provider secondary to complaint of urinary frequency.  Patient had urinalysis and testing for STIs.  Urine culture was negative.  Patient was prescribed Cipro but was contacted to stop the medication when the urine culture was negative.  Patient presents today with continued complaint of urinary frequency.  Patient states that he feels as if he is urinating every 15 minutes.  Patient also states that he feels as if he is getting up at least 20 times per night in order to urinate.  Patient does have some discomfort with urination and patient now with some onset of right-sided back pain as well as middle low back pain.  Patient also states that he has had some onset of dizziness if he leans forward or has a change in position.      Patient also with complaint of having some recurrent pain in the right groin/area of prior surgical repair of inguinal hernia.  Patient states that this pain has occurred off and on for the past year but is gradually getting worse.  Patient over the past week he has had mild fever and chills.  Patient also with some lower abdominal discomfort, patient feels as if his bladder is full at times and this causes discomfort/pressure sensation.  Patient reports that his lower abdomen/bladder feels better after he is able to urinate.  Patient is seen no blood in his urine.  Has seen no blood or penile discharge.  Patient does have some fatigue.  Patient does have history of prostatitis.  Patient is taking Flomax which was prescribed at his last visit.  Patient also reports history of being told that his bladder was too small and patient really had stretching of his bladder in the past by a specialist. Past Medical History:  Diagnosis Date  .  Depression   . ED (erectile dysfunction)   . Hyperlipidemia   . Hypertension   . Thyroid disease    Past Surgical History:  Procedure Laterality Date  . HERNIA REPAIR    . I&D EXTREMITY Left 10/26/2013   Procedure: IRRIGATION AND DEBRIDEMENT EXTREMITY AND REPAIR AS NECESSARY;  Surgeon: Roseanne Kaufman, MD;  Location: [place];  Service: Orthopedics;  Laterality: Left;  . INGUINAL HERNIA REPAIR  10/23/2012   Procedure: LAPAROSCOPIC INGUINAL HERNIA;  Surgeon: Ralene Ok, MD;  Location: [place];  Service: General;  Laterality: Left;  . INSERTION OF MESH  10/23/2012   Procedure: INSERTION OF MESH;  Surgeon: Ralene Ok, MD;  Location: [place];  Service: General;  Laterality: Left;  . NERVE, TENDON AND ARTERY REPAIR Left 10/29/2013   Procedure: IRRIGATION AND DEBRIDMENT LEFT HAND AND BURYING RADIAL DIGITAL NERVE TO LEFT THUMB;  Surgeon: Roseanne Kaufman, MD;  Location: [place];  Service: Orthopedics;  Laterality: Left;   Family History  Problem Relation Age of Onset  . Heart disease Unknown        No family history    Social History   Tobacco Use  . Smoking status: Former Smoker    Packs/day: 2.00    Years: 16.00    Pack years: 32.00    Last attempt to quit: 10/10/1992    Years since quitting: 25.7  . Smokeless tobacco: Never Used  Substance Use  Topics  . Alcohol use: Yes    Comment: reports glass of wine once a month  . Drug use: No  No Known Allergies  Review of Systems  Constitutional: Positive for fatigue. Negative for chills and fever.  Respiratory: Negative for cough and shortness of breath.   Cardiovascular: Negative for chest pain, palpitations and leg swelling.  Gastrointestinal: Positive for abdominal pain. Negative for nausea.  Endocrine: Positive for polyuria. Negative for polydipsia and polyphagia.  Genitourinary: Positive for flank pain and frequency. Negative for discharge, dysuria and hematuria.  Neurological: Positive for dizziness. Negative for headaches.         Objective:   Physical Exam BP 131/81   Pulse 75   Temp 98.3 F (36.8 C) (Oral)   Resp 18   Ht 5\' 4"  (1.626 m)   Wt 195 lb 12.8 oz (88.8 kg)   SpO2 97%   BMI 33.61 kg/m  Vital signs and nursing notes reviewed General- well-nourished, well-developed older male in no acute distress ENT- TMs gray, mild edema of the nasal turbinates, normal oropharynx, oral mucosa is slightly sticky but tongue appears pink and moist Neck-supple, no lymphadenopathy, no thyromegaly, no carotid bruit Lungs-clear to auscultation laterally Cardiovascular- regular rate and rhythm Abdomen- truncal obesity, patient with possible mild distention, bowel sounds are normal, patient with ventral and umbilical hernia.  Patient with some tenderness at the right inguinal canal but no palpable hernia Back- patient with right CVA tenderness on examination.  Patient also with midline L5-S1 discomfort to palpation and bilateral lumbar paraspinous spasm Extremities-no edema Rectal- exam was deferred as patient had already previously been started on antibiotic therapy     Assessment & Plan:  1. Acute prostatitis Based on patient's past history of prostatitis as well as recent urinalysis done at prior visit as well adding for STIs which showed no abnormalities, I suspect the patient with prostatitis.  Patient was given educational handout in Spanish on prostatitis.  Patient also had a PSA at his last visit which was within normal.  Patient has been referred to urology for evaluation and treatment due to his urinary frequency.  Patient has had hematuria on urinalysis at his last visit as well as today's visit.  Patient reports prior intervention to the bladder.  Patient is being placed on Septra double strength twice daily x14 days and hopefully he will be seen by urology within this time.  Patient should return if he has continued or worsening symptoms.  - Ambulatory referral to Urology  2. Urinary frequency With continued  urinary frequency which is suspected to be secondary to possible prostatitis but also check BMP to look for elevated blood sugar but also to see if patient is having any issues with hyponatremia or low potassium.  If sodium is low, patient may also need endocrinology referral for SIADH.  Patient is being referred to urology for further evaluation and treatment.  Patient also had urinalysis that today's.  Patient's urine culture from most recent visit was negative. - Ambulatory referral to Urology - Basic Metabolic Panel - POCT URINALYSIS DIP (CLINITEK)  3. Dizziness Patient with complaint of some dizziness and this may be due to dehydration urinary frequency as well as poor sleep related to frequent urination.  Patient will also have BMP to check for hyponatremia or low potassium which may also cause dizziness. - Basic Metabolic Panel  4. Fatigue, unspecified type Patient will have BMP and TSH in follow-up of his fatigue.  Patient also with urinalysis due to  urinary frequency.  Suspect that patient's sleep interruption due to urinary frequency as well as dehydration may also be contributing to his fatigue - Basic Metabolic Panel - TSH  5. Inguinal hernia of right side without obstruction or gangrene Patient with complaint of some pain in the area of prior surgical repair of inguinal hernia and patient will be referred to general surgery for further evaluation - Ambulatory referral to General Surgery  6. Umbilical hernia without obstruction and without gangrene- Patient will be referred to general surgery in follow-up of a umbilical hernia as well as complaint of discomfort at the site of prior right inguinal hernia repair -Ambulatory referral to General Surgery  7.  Hematuria Patient has had hematuria on last 2 UAs and patient reports prior intervention to stretch the bladder.  Patient has been referred to urology for further evaluation and treatment.  An After Visit Summary was printed and  given to the patient.  Return in about 1 week (around 07/05/2018).

## 2018-06-29 LAB — BASIC METABOLIC PANEL WITH GFR
BUN/Creatinine Ratio: 21 (ref 10–24)
BUN: 14 mg/dL (ref 8–27)
CO2: 22 mmol/L (ref 20–29)
Calcium: 9.1 mg/dL (ref 8.6–10.2)
Chloride: 105 mmol/L (ref 96–106)
Creatinine, Ser: 0.68 mg/dL — ABNORMAL LOW (ref 0.76–1.27)
GFR calc Af Amer: 119 mL/min/1.73
GFR calc non Af Amer: 103 mL/min/1.73
Glucose: 84 mg/dL (ref 65–99)
Potassium: 4.4 mmol/L (ref 3.5–5.2)
Sodium: 142 mmol/L (ref 134–144)

## 2018-06-29 LAB — TSH: TSH: 1.52 u[IU]/mL (ref 0.450–4.500)

## 2018-07-02 ENCOUNTER — Ambulatory Visit: Payer: Self-pay | Attending: Family Medicine

## 2018-07-03 ENCOUNTER — Telehealth: Payer: Self-pay

## 2018-07-03 NOTE — Telephone Encounter (Signed)
[employer]  [name]  Id#  [id number] contacted pt to go over lab results pt is aware of the results and doesn't have any questions or concerns

## 2018-07-13 ENCOUNTER — Telehealth: Payer: Self-pay | Admitting: Family Medicine

## 2018-07-13 ENCOUNTER — Ambulatory Visit: Payer: Self-pay | Attending: Family Medicine

## 2018-07-13 NOTE — Telephone Encounter (Signed)
Pt came in to request two referrals now that he has financial assistance and the [place], he states he spoke with his provider about a hernia and a bladder issue. Please advise and refer out to appropriate locations

## 2018-07-16 ENCOUNTER — Other Ambulatory Visit: Payer: Self-pay | Admitting: Family Medicine

## 2018-07-16 DIAGNOSIS — K4090 Unilateral inguinal hernia, without obstruction or gangrene, not specified as recurrent: Secondary | ICD-10-CM

## 2018-07-16 DIAGNOSIS — N419 Inflammatory disease of prostate, unspecified: Secondary | ICD-10-CM

## 2018-07-16 DIAGNOSIS — R350 Frequency of micturition: Secondary | ICD-10-CM

## 2018-07-16 DIAGNOSIS — K429 Umbilical hernia without obstruction or gangrene: Secondary | ICD-10-CM

## 2018-07-16 NOTE — Progress Notes (Signed)
Patient ID: [name], male   DOB: 1956/03/25, 62 y.o.   MRN: [id number]   Patient placed call to the office requesting referrals for urology and surgery in follow-up of his urinary frequency and possible prostatitis as well as right inguinal hernia as patient now with financial assistance.  Referrals will be placed and patient will be notified by office staff.  Patient will also be encouraged to return sooner if he has continued urinary symptoms between now and when his urology appointment is scheduled.  Urology referral was also placed at his last visit on 06/28/2018 as well as referral to general surgery due to inguinal hernia and umbilical hernia.  Patient did not keep his one-week follow-up appointment.

## 2018-07-16 NOTE — Telephone Encounter (Signed)
Please notify patient that new referrals were placed for both urology and general surgery.  These referrals were also placed at his recent visit on 06/28/2018. (Patient also failed to keep his 1 week follow-up appointment after his last visit).

## 2018-08-02 ENCOUNTER — Ambulatory Visit: Payer: Self-pay | Admitting: Urology

## 2018-08-02 NOTE — Progress Notes (Deleted)
08/02/2018 8:25 AM   [name] 10-18-1955 [id number]  Referring provider: Antony Blackbird, MD [place], [place] [address]  No chief complaint on file.   HPI:    PMH: Past Medical History:  Diagnosis Date  . Depression   . ED (erectile dysfunction)   . Hyperlipidemia   . Hypertension   . Thyroid disease     Surgical History: Past Surgical History:  Procedure Laterality Date  . HERNIA REPAIR    . I&D EXTREMITY Left 10/26/2013   Procedure: IRRIGATION AND DEBRIDEMENT EXTREMITY AND REPAIR AS NECESSARY;  Surgeon: Roseanne Kaufman, MD;  Location: [facility];  Service: Orthopedics;  Laterality: Left;  . INGUINAL HERNIA REPAIR  10/23/2012   Procedure: LAPAROSCOPIC INGUINAL HERNIA;  Surgeon: Ralene Ok, MD;  Location: [facility];  Service: General;  Laterality: Left;  . INSERTION OF MESH  10/23/2012   Procedure: INSERTION OF MESH;  Surgeon: Ralene Ok, MD;  Location: [facility];  Service: General;  Laterality: Left;  . NERVE, TENDON AND ARTERY REPAIR Left 10/29/2013   Procedure: IRRIGATION AND DEBRIDMENT LEFT HAND AND BURYING RADIAL DIGITAL NERVE TO LEFT THUMB;  Surgeon: Roseanne Kaufman, MD;  Location: [facility];  Service: Orthopedics;  Laterality: Left;    Home Medications:  Allergies as of 08/02/2018   No Known Allergies     Medication List        Accurate as of 08/02/18  8:25 AM. Always use your most recent med list.          ciprofloxacin 500 MG tablet Commonly known as:  CIPRO Take 1 tablet (500 mg total) by mouth 2 (two) times daily.   tamsulosin 0.4 MG Caps capsule Commonly known as:  FLOMAX Take 1 capsule (0.4 mg total) by mouth daily.       Allergies: No Known Allergies  Family History: Family History  Problem Relation Age of Onset  . Heart disease Unknown        No family history     Social History:  reports that he quit smoking about 25 years ago. He has a 32.00 pack-year smoking history. He has never used smokeless tobacco. He reports  that he drinks alcohol. He reports that he does not use drugs.  ROS:                                        Physical Exam: There were no vitals taken for this visit.  Constitutional:  Alert and oriented, No acute distress. HEENT: [place] AT, moist mucus membranes.  Trachea midline, no masses. Cardiovascular: No clubbing, cyanosis, or edema. Respiratory: Normal respiratory effort, no increased work of breathing. GI: Abdomen is soft, nontender, nondistended, no abdominal masses GU: No CVA tenderness Lymph: No cervical or inguinal lymphadenopathy. Skin: No rashes, bruises or suspicious lesions. Neurologic: Grossly intact, no focal deficits, moving all 4 extremities. Psychiatric: Normal mood and affect.  Laboratory Data: Lab Results  Component Value Date   WBC 12.8 (H) 10/26/2013   HGB 15.7 10/26/2013   HCT 43.5 10/26/2013   MCV 87.0 10/26/2013   PLT 164 10/26/2013    Lab Results  Component Value Date   CREATININE 0.68 (L) 06/28/2018    No results found for: PSA  No results found for: TESTOSTERONE  Lab Results  Component Value Date   HGBA1C  02/24/2011    5.0 (NOTE)  According to the ADA Clinical Practice Recommendations for 2011, when HbA1c is used as a screening test:   >=6.5%   Diagnostic of Diabetes Mellitus           (if abnormal result  is confirmed)  5.7-6.4%   Increased risk of developing Diabetes Mellitus  References:Diagnosis and Classification of Diabetes Mellitus,Diabetes LHTD,4287,68(TLXBW 1):S62-S69 and Standards of Medical Care in         Diabetes - 2011,Diabetes IOMB,5597,41  (Suppl 1):S11-S61.    Urinalysis    Component Value Date/Time   COLORURINE YELLOW 02/07/2016 1424   APPEARANCEUR CLEAR 02/07/2016 1424   LABSPEC 1.016 02/07/2016 1424   PHURINE 5.5 02/07/2016 1424   GLUCOSEU NEGATIVE 02/07/2016 1424   HGBUR MODERATE (A) 02/07/2016 1424   BILIRUBINUR negative  06/28/2018 1610   KETONESUR negative 06/28/2018 1610   KETONESUR NEGATIVE 02/07/2016 1424   PROTEINUR NEGATIVE 02/07/2016 1424   UROBILINOGEN 0.2 06/28/2018 1610   NITRITE Negative 06/28/2018 1610   NITRITE NEGATIVE 02/07/2016 1424   LEUKOCYTESUR Negative 06/28/2018 1610    Lab Results  Component Value Date   BACTERIA NONE SEEN 02/07/2016    Pertinent Imaging: *** No results found for this or any previous visit. No results found for this or any previous visit. No results found for this or any previous visit. No results found for this or any previous visit. No results found for this or any previous visit. No results found for this or any previous visit. No results found for this or any previous visit. No results found for this or any previous visit.  Assessment & Plan:    There are no diagnoses linked to this encounter.  No follow-ups on file.  Hollice Espy, MD  [facility] [address], [place], [place] [address] [phone]

## 2018-08-09 ENCOUNTER — Encounter

## 2018-08-09 ENCOUNTER — Ambulatory Visit (INDEPENDENT_AMBULATORY_CARE_PROVIDER_SITE_OTHER): Payer: Self-pay | Admitting: Urology

## 2018-08-09 ENCOUNTER — Encounter: Payer: Self-pay | Admitting: Urology

## 2018-08-09 VITALS — BP 136/95 | HR 70 | Ht 65.0 in | Wt 192.0 lb

## 2018-08-09 DIAGNOSIS — R3129 Other microscopic hematuria: Secondary | ICD-10-CM

## 2018-08-09 DIAGNOSIS — R350 Frequency of micturition: Secondary | ICD-10-CM

## 2018-08-09 LAB — URINALYSIS, COMPLETE
BILIRUBIN UA: NEGATIVE
Glucose, UA: NEGATIVE
Ketones, UA: NEGATIVE
Leukocytes, UA: NEGATIVE
Nitrite, UA: NEGATIVE
PH UA: 5.5 (ref 5.0–7.5)
Specific Gravity, UA: 1.025 (ref 1.005–1.030)
Urobilinogen, Ur: 0.2 mg/dL (ref 0.2–1.0)

## 2018-08-09 LAB — MICROSCOPIC EXAMINATION: Epithelial Cells (non renal): NONE SEEN /hpf (ref 0–10)

## 2018-08-09 LAB — BLADDER SCAN AMB NON-IMAGING: Scan Result: 0

## 2018-08-09 MED ORDER — MIRABEGRON ER 25 MG PO TB24: 25.0000 mg | ORAL_TABLET | Freq: Every day | ORAL | 0 refills | Status: DC

## 2018-08-09 MED ORDER — NYSTATIN-TRIAMCINOLONE 100000-0.1 UNIT/GM-% EX OINT: 1.0000 "application " | TOPICAL_OINTMENT | Freq: Two times a day (BID) | CUTANEOUS | 0 refills | Status: DC

## 2018-08-09 NOTE — Progress Notes (Signed)
08/09/2018 2:01 PM   [name] Aug 09, 1956 [id number]  Referring provider: Antony Blackbird, MD [place], [place] [address]  Chief Complaint  Patient presents with  . Prostatitis    HPI: Patient is 62 year old Hispanic male who was referred by Dr. Antony Blackbird for urinary frequency who presents with interpreter, [name].    He states that he urinates too much and sometimes is difficult to urinate.  He also has pain in his lower abdomen.  He complains of frequency, urgency, dysuria, nocturia, intermittency, hesitancy, straining to urinate and a weak urinary stream.  He has had these symptoms for two years.  Patient denies any gross hematuria or flank pain.  Patient denies any fevers, chills, nausea or vomiting.   His UA today is positive for 11-30 RBCs and moderate bacteria.  His PVR is 0 mL.    He had been evaluated by to urology offices previously.  In 2018 he was seen by urology specialty care in [facility] and diagnosed with chronic prostatitis/pelvic pain syndrome.  He was then evaluated in 2019 through [facility] in [place].  He was having the same complaints and was tried on Flomax, finasteride and Elmiron and without relief.  He underwent MR and CT urogram's which demonstrated benign cysts.  A TRUS showed a 30 cc prostate.  He underwent cystoscopy and was noted to have erythema at the bladder base, but biopsies and cytology were negative.  He also underwent cystoscopy with hydrodistention on January 15, 2018.    He also had ED for which he was taking sildenafil.    Former smoker.  Quit in 1998.    PMH: Past Medical History:  Diagnosis Date  . Anxiety   . Depression   . ED (erectile dysfunction)   . Hyperlipidemia   . Hypertension   . Thyroid disease     Surgical History: Past Surgical History:  Procedure Laterality Date  . HERNIA REPAIR    . I&D EXTREMITY Left 10/26/2013   Procedure: IRRIGATION AND DEBRIDEMENT  EXTREMITY AND REPAIR AS NECESSARY;  Surgeon: Roseanne Kaufman, MD;  Location: [facility];  Service: Orthopedics;  Laterality: Left;  . INGUINAL HERNIA REPAIR  10/23/2012   Procedure: LAPAROSCOPIC INGUINAL HERNIA;  Surgeon: Ralene Ok, MD;  Location: [facility];  Service: General;  Laterality: Left;  . INSERTION OF MESH  10/23/2012   Procedure: INSERTION OF MESH;  Surgeon: Ralene Ok, MD;  Location: [facility];  Service: General;  Laterality: Left;  . NERVE, TENDON AND ARTERY REPAIR Left 10/29/2013   Procedure: IRRIGATION AND DEBRIDMENT LEFT HAND AND BURYING RADIAL DIGITAL NERVE TO LEFT THUMB;  Surgeon: Roseanne Kaufman, MD;  Location: [facility];  Service: Orthopedics;  Laterality: Left;    Home Medications:  Allergies as of 08/09/2018   No Known Allergies     Medication List        Accurate as of 08/09/18  2:01 PM. Always use your most recent med list.          ciprofloxacin 500 MG tablet Commonly known as:  CIPRO Take 1 tablet (500 mg total) by mouth 2 (two) times daily.   mirabegron ER 25 MG Tb24 tablet Commonly known as:  MYRBETRIQ Take 1 tablet (25 mg total) by mouth daily.   tamsulosin 0.4 MG Caps capsule Commonly known as:  FLOMAX Take 1 capsule (0.4 mg total) by mouth daily.       Allergies: No Known Allergies  Family History: Family History  Problem Relation  Age of Onset  . Heart disease Unknown        No family history     Social History:  reports that he quit smoking about 25 years ago. He has a 32.00 pack-year smoking history. He has never used smokeless tobacco. He reports that he drinks alcohol. He reports that he does not use drugs.  ROS: UROLOGY Frequent Urination?: Yes Hard to postpone urination?: Yes Burning/pain with urination?: Yes Get up at night to urinate?: Yes Leakage of urine?: No Urine stream starts and stops?: Yes Trouble starting stream?: Yes Do you have to strain to urinate?: Yes Urinary tract infection?: Yes Sexually transmitted disease?:  Yes Injury to kidneys or bladder?: No Painful intercourse?: Yes Weak stream?: Yes Erection problems?: Yes Penile pain?: Yes  Gastrointestinal Nausea?: No Indigestion/heartburn?: No Diarrhea?: No Constipation?: No  Constitutional Fever: No Night sweats?: No Weight loss?: No Fatigue?: Yes  Skin Skin rash/lesions?: Yes Itching?: Yes  Eyes Blurred vision?: Yes Double vision?: No  Ears/Nose/Throat Sore throat?: Yes Sinus problems?: No  Hematologic/Lymphatic Swollen glands?: No Easy bruising?: No  Cardiovascular Leg swelling?: No Chest pain?: No  Respiratory Cough?: Yes Shortness of breath?: Yes  Endocrine Excessive thirst?: No  Musculoskeletal Back pain?: Yes Joint pain?: No  Neurological Headaches?: Yes Dizziness?: No  Psychologic Depression?: No Anxiety?: Yes  Physical Exam: BP (!) 136/95 (BP Location: Left Arm, Patient Position: Sitting, Cuff Size: Normal)   Pulse 70   Ht 5\' 5"  (1.651 m)   Wt 192 lb (87.1 kg)   BMI 31.95 kg/m   Constitutional:  Well nourished. Alert and oriented, No acute distress. HEENT: [place] AT, moist mucus membranes.  Trachea midline, no masses. Cardiovascular: No clubbing, cyanosis, or edema. Respiratory: Normal respiratory effort, no increased work of breathing. GI: Abdomen is soft, non tender, non distended, no abdominal masses. Liver and spleen not palpable.  No hernias appreciated.  Stool sample for occult testing is not indicated.   GU: No CVA tenderness.  No bladder fullness or masses.  Patient with uncircumcised phallus.   Foreskin easily retracted.Marland Kitchen Urethral meatus is patent.  No penile discharge.  Balanitis present.  Scrotum without lesions, cysts, rashes and/or edema.  Testicles are located scrotally bilaterally. No masses are appreciated in the testicles. Left and right epididymis are normal. Rectal: Patient with  normal sphincter tone. Anus and perineum without scarring or rashes. No rectal masses are appreciated.  Prostate is approximately 60 + grams, could only palpate the apex and midportion, no nodules are appreciated. Seminal vesicles are normal. Skin: No rashes, bruises or suspicious lesions. Lymph: No cervical or inguinal adenopathy. Neurologic: Grossly intact, no focal deficits, moving all 4 extremities. Psychiatric: Normal mood and affect.  Laboratory Data: PSA 0.4 in 04/2018  Lab Results  Component Value Date   WBC 12.8 (H) 10/26/2013   HGB 15.7 10/26/2013   HCT 43.5 10/26/2013   MCV 87.0 10/26/2013   PLT 164 10/26/2013    Lab Results  Component Value Date   CREATININE 0.68 (L) 06/28/2018    No results found for: PSA  No results found for: TESTOSTERONE  Lab Results  Component Value Date   HGBA1C  02/24/2011    5.0 (NOTE)  According to the ADA Clinical Practice Recommendations for 2011, when HbA1c is used as a screening test:   >=6.5%   Diagnostic of Diabetes Mellitus           (if abnormal result  is confirmed)  5.7-6.4%   Increased risk of developing Diabetes Mellitus  References:Diagnosis and Classification of Diabetes Mellitus,Diabetes RXVQ,0086,76(PPJKD 1):S62-S69 and Standards of Medical Care in         Diabetes - 2011,Diabetes TOIZ,1245,80  (Suppl 1):S11-S61.    Lab Results  Component Value Date   TSH 1.520 06/28/2018    No results found for: CHOL, HDL, CHOLHDL, VLDL, LDLCALC  Lab Results  Component Value Date   AST 20 06/14/2018   Lab Results  Component Value Date   ALT 24 06/14/2018   No components found for: ALKALINEPHOPHATASE No components found for: BILIRUBINTOTAL  No results found for: ESTRADIOL  Urinalysis 11-30 RBC's and moderate bacteria.  See Epic.  I have reviewed the labs.   Pertinent Imaging:  Results for FUE, CERVENKA (MRN [id number]) as of 09/02/2018 20:44  Ref. Range 08/09/2018 13:38  Scan Result Unknown 0    Assessment & Plan:    1. Microscopic hematuria I  explained to the patient that there are a number of causes that can be associated with blood in the urine, such as stones, BPH, UTI's, damage to the urinary tract and/or cancer. At this time, I felt that the patient warranted further urologic evaluation with 3 or greater RBC's/hpf on microscopic evaluation of the urine.  The AUA guidelines state that a CT urogram is the preferred imaging study to evaluate hematuria. I explained to the patient that a contrast material will be injected into a vein and that in rare instances, an allergic reaction can result and may even life threatening   The patient denies any allergies to contrast, iodine and/or seafood and is not taking metformin The patient had the opportunity to ask questions which were answered. Based upon this discussion, the patient is willing to proceed. Therefore, I've ordered: a CT Urogram.  The patient will return following all of the above for discussion of the results.  UA Urine culture BUN + creatinine    2. Urinary frequency Offered medical therapy with beta-3 adrenergic receptor agonist -given Myrbetriq 25 mg samples, #28.  I have reviewed with the patient of the side effects of Myrbetriq, such as: elevation in BP, urinary retention and/or HA.   RTC in 3 weeks for PVR and I PSS score - Urinalysis, Complete - PSA - Bladder Scan (Post Void Residual) in office  3. Balanitis Sent a prescription for Mycolog cream to his pharmacy to apply to the penis twice daily He is instructed to pull the foreskin back wash that the penis with soapy water and dry thoroughly and apply the ointment twice daily    Return in about 3 weeks (around 08/30/2018) for CTU report , IPSS and PVR.  These notes generated with voice recognition software. I apologize for typographical errors.  Zara Council, PA-C  [facility] [address]  [place], [place] [address] [phone]

## 2018-08-10 LAB — PSA: Prostate Specific Ag, Serum: <0.1 ng/mL (ref 0.0–4.0)

## 2018-08-16 ENCOUNTER — Encounter: Payer: Self-pay | Admitting: Surgery

## 2018-08-16 ENCOUNTER — Other Ambulatory Visit: Payer: Self-pay

## 2018-08-16 ENCOUNTER — Ambulatory Visit (INDEPENDENT_AMBULATORY_CARE_PROVIDER_SITE_OTHER): Payer: Self-pay | Admitting: Surgery

## 2018-08-16 VITALS — BP 157/109 | HR 71 | Temp 97.7°F | Wt 194.0 lb

## 2018-08-16 DIAGNOSIS — K4091 Unilateral inguinal hernia, without obstruction or gangrene, recurrent: Secondary | ICD-10-CM

## 2018-08-16 DIAGNOSIS — K5900 Constipation, unspecified: Secondary | ICD-10-CM

## 2018-08-16 DIAGNOSIS — K429 Umbilical hernia without obstruction or gangrene: Secondary | ICD-10-CM

## 2018-08-16 NOTE — Progress Notes (Signed)
Surgical Clinic History and Physical  Referring provider:  Antony Blackbird, MD [place], [place] [address]  HISTORY OF PRESENT ILLNESS (HPI):  62 y.o. male presents for evaluation of his umbilical hernia, though during his appointment also inquires regarding constipation, proctitis, and Right scrotal pain. Patient reports he has experienced constipation x 2 years with intermittent small amounts of blood per rectum when he strains to pass BM's. He "sometimes" eats prunes for his constipation, otherwise takes Pepto Bismol and employs sitz baths "to loosen" his BM's, and has not previously underwent screening colonoscopy. 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 Over the past 8 months, patient also says he has experienced Right groin pain, which radiates to his Right scrotum. He says he underwent open repair of his Right inguinal hernia in [place] 30 years ago and has more recently had to manually self-reduce a bulge of his Right groin. Lastly, patient also inquires regarding a non-painful asymptomatic bulge he has noticed from his epigastrium to above his umbilicus. Though patient denies straining with urination, he is referred to urology for evaluation/management of his chronic prostatitis. Patient otherwise denies any frequent coughing, though states he quit smoking 20 years ago after smoking 1 ppd x 15 years, and denies any fever/chills, N/V, CP, or SOB. Specifically, patient says he is able to ambulate at least 30 minutes - 2 hours and ascend/descend 1 - 2 flights of steps without experiencing CP or SOB.  All of the above were assessed with the assistance of certified Spanish-English language translator.  PAST MEDICAL HISTORY (PMH):  Past Medical History:  Diagnosis Date  . Anxiety   . Depression   . ED (erectile dysfunction)   .  Hyperlipidemia   . Hypertension   . Thyroid disease    PAST SURGICAL HISTORY ([facility]):  Past Surgical History:  Procedure Laterality Date  . HERNIA REPAIR Right 1990   right inguinal hernia, done in [place]  . I&D EXTREMITY Left 10/26/2013   Procedure: IRRIGATION AND DEBRIDEMENT EXTREMITY AND REPAIR AS NECESSARY;  Surgeon: Roseanne Kaufman, MD;  Location: [facility];  Service: Orthopedics;  Laterality: Left;  . INGUINAL HERNIA REPAIR  10/23/2012   Procedure: LAPAROSCOPIC INGUINAL HERNIA;  Surgeon: Ralene Ok, MD;  Location: [facility];  Service: General;  Laterality: Left;  . INSERTION OF MESH  10/23/2012   Procedure: INSERTION OF MESH;  Surgeon: Ralene Ok, MD;  Location: [facility];  Service: General;  Laterality: Left;  . NERVE, TENDON AND ARTERY REPAIR Left 10/29/2013   Procedure: IRRIGATION AND DEBRIDMENT LEFT HAND AND BURYING RADIAL DIGITAL NERVE TO LEFT THUMB;  Surgeon: Roseanne Kaufman, MD;  Location: [facility];  Service: Orthopedics;  Laterality: Left;    MEDICATIONS:  Prior to Admission medications   Medication Sig Start Date End Date Taking? Authorizing Provider  mirabegron ER (MYRBETRIQ) 25 MG TB24 tablet Take 1 tablet (25 mg total) by mouth daily. 08/09/18  Yes McGowan, [name] A, PA-C  nystatin-triamcinolone ointment (MYCOLOG) Apply 1 application topically 2 (two) times daily. 08/09/18  Yes McGowan, [name] A, PA-C  tamsulosin (FLOMAX) 0.4 MG CAPS capsule Take 1 capsule (0.4 mg total) by mouth daily. 06/14/18  Yes Argentina Donovan, PA-C    ALLERGIES:  No Known Allergies   SOCIAL HISTORY:  Social History   Socioeconomic History  . Marital status: Single    Spouse name: Not on file  .  Number of children: 5  . Years of education: Not on file  . Highest education level: Not on file  Occupational History    Employer: [employer]    Comment: T[employer]  Social Needs  . Financial resource strain: Not on file  . Food insecurity:    Worry: Not on file    Inability: Not on file  .  Transportation needs:    Medical: Not on file    Non-medical: Not on file  Tobacco Use  . Smoking status: Former Smoker    Packs/day: 2.00    Years: 16.00    Pack years: 32.00    Last attempt to quit: 10/10/1992    Years since quitting: 25.8  . Smokeless tobacco: Never Used  Substance and Sexual Activity  . Alcohol use: Yes    Comment: reports glass of wine once a month  . Drug use: No  . Sexual activity: Yes  Lifestyle  . Physical activity:    Days per week: Not on file    Minutes per session: Not on file  . Stress: Not on file  Relationships  . Social connections:    Talks on phone: Not on file    Gets together: Not on file    Attends religious service: Not on file    Active member of club or organization: Not on file    Attends meetings of clubs or organizations: Not on file    Relationship status: Not on file  . Intimate partner violence:    Fear of current or ex partner: Not on file    Emotionally abused: Not on file    Physically abused: Not on file    Forced sexual activity: Not on file  Other Topics Concern  . Not on file  Social History Narrative  . Not on file    The patient currently resides (home / rehab facility / nursing home): Home The patient normally is (ambulatory / bedbound): Ambulatory  FAMILY HISTORY:  Family History  Problem Relation Age of Onset  . Heart disease Unknown        No family history   . Diabetes Mother     Otherwise negative/non-contributory.  REVIEW OF SYSTEMS:  Constitutional: denies any other weight loss, fever, chills, or sweats  Eyes: denies any other vision changes, history of eye injury  ENT: denies sore throat, hearing problems  Respiratory: denies shortness of breath, wheezing  Cardiovascular: denies chest pain, palpitations  Gastrointestinal: abdominal pain, N/V, and bowel function as per HPI Musculoskeletal: denies any other joint pains or cramps  Skin: Denies any other rashes or skin discolorations Neurological:  denies any other headache, dizziness, weakness  Psychiatric: Denies any other depression, anxiety   All other review of systems were otherwise negative   VITAL SIGNS:  BP (!) 157/109   Pulse 71   Temp 97.7 F (36.5 C) (Skin)   Wt 194 lb (88 kg)   BMI 32.28 kg/m   PHYSICAL EXAM:  Constitutional:  -- Overweight body habitus  -- Awake, alert, and oriented x3  Eyes:  -- Pupils equally round and reactive to light  -- No scleral icterus  Ear, nose, throat:  -- No jugular venous distension -- No nasal drainage, bleeding Pulmonary:  -- No crackles  -- Equal breath sounds bilaterally -- Breathing non-labored at rest Cardiovascular:  -- S1, S2 present  -- No pericardial rubs  Gastrointestinal:  -- Abdomen soft, nontender, and non-distended with no guarding/rebound tenderness -- No abdominal masses appreciated, pulsatile  or otherwise, except reducible umbilical and recurrent Right inguinal hernias Musculoskeletal and Integumentary:  -- Wounds or skin discoloration: None appreciated -- Extremities: B/L UE and LE FROM, hands and feet warm, no edema  Neurologic:  -- Motor function: Intact and symmetric -- Sensation: Intact and symmetric  Labs:  CBC Latest Ref Rng & Units 10/26/2013 10/19/2012 03/18/2011  WBC 4.0 - 10.5 K/uL 12.8(H) 9.5 -  Hemoglobin 13.0 - 17.0 g/dL 15.7 16.6 14.3  Hematocrit 39.0 - 52.0 % 43.5 47.2 42.0  Platelets 150 - 400 K/uL 164 160 -   CMP Latest Ref Rng & Units 06/28/2018 06/14/2018 10/26/2013  Glucose 65 - 99 mg/dL 84 76 116(H)  BUN 8 - 27 mg/dL 14 11 16   Creatinine 0.76 - 1.27 mg/dL 0.68(L) 0.79 0.84  Sodium 134 - 144 mmol/L 142 142 144  Potassium 3.5 - 5.2 mmol/L 4.4 4.2 4.0  Chloride 96 - 106 mmol/L 105 103 105  CO2 20 - 29 mmol/L 22 25 26   Calcium 8.6 - 10.2 mg/dL 9.1 9.1 8.5  Total Protein 6.0 - 8.5 g/dL - 7.0 -  Total Bilirubin 0.0 - 1.2 mg/dL - 0.7 -  Alkaline Phos 39 - 117 IU/L - 72 -  AST 0 - 40 IU/L - 20 -  ALT 0 - 44 IU/L - 24 -   Imaging  studies: Currently no recent pertinent imaging studies available for review, though CT scheduled 11/15 for hematuria workup   Assessment/Plan: (ICD-10's: K40.91, K42.9) 62 y.o. male with increasingly symptomatic (painful) reducible recurrent Right inguinal hernia and umbilical hernia, complicated by pertinent comorbidities including obesity (BMI >32), HTN, HLD, thyroid disease (not otherwise specified), erectile dysfunction, generalized anxiety disorder, and major depression disorder.               - discussed with patient signs and symptoms of hernia incarceration and obstruction             - strategies for manual self-reduction of patient's hernia also reviewed and discussed  - maintain hydration with high fiber heart healthy diet to reduce/minimize constipation +/- daily stool softener as needed and Miralax prn for constipation despite stool hydration, fiber, and Colace             - all risks, benefits, and alternatives to repair of recurrent Right inguinal and umbilical hernias with mesh were discussed with the patient, all of his questions were answered to patient's expressed satisfaction, patient expresses he wishes to proceed, and informed consent was obtained.             - will plan for laparoscopic repair of recurrent Right inguinal hernia with mesh and repair of umbilical hernia with mesh pending anesthesia and OR availability and to follow advised colonoscopy considering constipation with blood per rectum and overdue for screening colonoscopy  - information regarding diastasis recti discussed and provided, can obtain CT if becomes bothersome to confirm no ventral hernia, though appears consistent with diastasis  - follow up in office to schedule surgery following anticipated colonoscopy, will provide referral             - anticipate return to clinic 2 weeks after above planned surgery             - instructed to call if any questions or concerns  - management of prostatitis as per  urology  All of the above recommendations were discussed with the patient, and all of patient's questions were answered to his expressed satisfaction.  Thank you for  the opportunity to participate in this patient's care.  -- Marilynne Drivers Rosana Hoes, MD, [place]: [facility] - Partnering for exceptional care. Office: [phone]

## 2018-08-16 NOTE — Patient Instructions (Addendum)
Lo vamos a referir con Social research officer, government para que le puedan hacer una colonoscopia antes de programar su [place].   Dieta rica en fibra High-Fiber Diet La fibra, tambin llamada fibra dietaria, es un tipo de carbohidrato que se encuentra en las frutas, las verduras, los cereales integrales y los frijoles. Una dieta rica en fibra puede tener muchos beneficios para la salud. El mdico puede recomendar una dieta rica en fibra para ayudar a:  Contractor. La fibra puede hacer que defeque con ms frecuencia.  Disminuir el nivel de colesterol.  [place] hemorroides, la diverticulosis no complicada o el sndrome de colon irritable.  Evitar comer en exceso como parte de un plan para bajar de peso.  Evitar la enfermedad cardaca, la diabetes tipo 2 y ciertos cnceres.  En qu consiste el plan? El consumo diario recomendado de fibra incluye lo siguiente:  38gramos para los hombres menores de 42 aos.  30gramos para los hombres mayores de 50 aos.  25gramos para las mujeres menores de 50 aos.  21gramos para las Cendant Corporation de 50 aos.  Puede lograr el consumo diario recomendado de fibra si come una variedad de frutas, verduras, cereales y frijoles. El mdico tambin puede recomendar un suplemento de fibra si no es posible obtener suficiente fibra a travs de la dieta. Qu debo saber acerca de la dieta rica en fibra?  La eficacia de los suplementos de [place] no ha sido estudiada ampliamente, de modo que es mejor obtener fibra directamente de los alimentos.  Verifique siempre el contenido de fibra en la etiqueta de informacin nutricional de los alimentos preenvasados. Busque alimentos que contengan al menos 5gramos de fibra por porcin.  Consulte a un nutricionista si tiene preguntas sobre algunos alimentos especficos relacionados con su enfermedad, especialmente si estos alimentos no se mencionan a continuacin.  Aumente el consumo diario de fibra en forma gradual.  Aumentar demasiado rpido el consumo de fibra dietaria puede provocar distensin abdominal, clicos o gases.  Beber abundante agua. El [place] a Economist. Qu alimentos puedo comer? Cereales Panes integrales. Cereal multigrano. Avena. Arroz integral. Dwyane Luo. Trigo burgol. Mijo. Magdalenas de salvado. Palomitas de maz. Galletas de centeno. Verduras Batatas. Espinaca. Col rizada. Alcachofas. Repollo. Brcoli. Guisantes. Zanahorias. [place]. Lambert Mody Bayas. Peras. Manzanas. Naranjas. Aguacates. Ciruelas y pasas. Higos secos. Carnes y otras fuentes de protenas Frijoles blancos, colorados, pintos y porotos de soja. Guisantes secos. Lentejas. Frutos secos y semillas. Lcteos Yogur fortificado con Pharmacist, hospital. Bebidas Leche de soja fortificada con Fredderick Phenix. Jugo de naranja fortificado con Fredderick Phenix. Otros Barras de [place]. Es posible que los productos que se enumeran ms arriba no sean una lista completa de las bebidas o los alimentos recomendados. Comunquese con el nutricionista para conocer ms opciones. Qu alimentos no se recomiendan? Cereales Pan blanco. Pastas hechas con Letitia Neri. Arroz blanco. Verduras Papas fritas. Verduras enlatadas. Verduras muy cocidas. Frutas Jugo de frutas. Frutas cocidas coladas. Carnes y otras fuentes de protenas Cortes de carne con alto contenido de Lobbyist. Aves o pescados fritos. Lcteos Leche. Yogur. Queso crema. [employer]. Bebidas Gaseosas. Otros Tortas y pasteles. [place] y aceites. Es posible que los productos que se enumeran ms arriba no sean una lista completa de los alimentos y las bebidas que se Higher education careers adviser. Comunquese con el nutricionista para obtener ms informacin. Cules son algunos consejos para incluir alimentos ricos en fibra en la dieta?  Consuma una gran variedad de alimentos ricos en fibra.  Asegrese de que la mitad de todos los cereales  consumidos cada da sean cereales integrales.  Reemplace los panes y  cereales hechos de harina refinada o harina blanca por panes y cereales integrales.  Reemplace el arroz blanco por arroz integral, trigo burgol o mijo.  Comience Games developer con un desayuno rico en [place], como un cereal que contenga al menos 5gramos de fibra por porcin.  Use guisantes en lugar de carne en las sopas, ensaladas o pastas.  Coma refrigerios ricos en fibra, como frutos rojos, verduras crudas, frutos secos o palomitas de maz. Esta informacin no tiene Marine scientist el consejo del mdico. Asegrese de hacerle al mdico cualquier pregunta que tenga. Document Released: 09/26/2005 Document Revised: 02/01/2017 Document Reviewed: 03/11/2014 Elsevier Interactive Patient Education  2018 [place] en los adultos Constipation, Adult Se llama estreimiento cuando:  Tiene deposiciones (defeca) una menor cantidad de veces a la semana de lo normal.  Tiene dificultad para defecar.  Las heces son secas y duras o son ms grandes que lo normal.  Siga estas indicaciones en su casa: Comida y bebida   Consuma alimentos con alto contenido de [place], por ejemplo: ? Lambert Mody y verduras frescas. ? Cereales integrales. ? Frijoles.  Consuma una menor cantidad de alimentos ricos en grasas, con bajo contenido de [place] o excesivamente procesados, como: ? Papas fritas. ? Hamburguesas. ? Galletas. ? Caramelos. ? Gaseosas.  Beba suficiente lquido para mantener el pis (orina) claro o de color amarillo plido. Instrucciones generales  Haga actividad fsica con regularidad o segn las indicaciones del mdico.  Vaya al bao cuando sienta la necesidad de defecar. No se aguante las ganas.  Tome los medicamentos de venta libre y los recetados solamente como se lo haya indicado el mdico. Estos incluyen los suplementos de [place].  Realice ejercicios de reentrenamiento del suelo plvico, como: ? Respirar profundamente mientras relaja la parte inferior del vientre  (abdomen). ? Relajar el suelo plvico mientras defeca.  Controle su afeccin para ver si hay cambios.  Concurra a todas las visitas de control como se lo haya indicado el mdico. Esto es importante. Comunquese con un mdico si:  Siente un dolor que empeora.  Tiene fiebre.  No ha defecado por 4das.  Vomita.  No tiene hambre.  Pierde peso.  Tiene una hemorragia en el ano.  Las deposiciones Media planner) son delgadas como un lpiz. Solicite ayuda de inmediato si:  Jaclynn Guarneri, y los sntomas empeoran de repente.  Tiene prdida de materia fecal u observa IAC/InterActiveCorp.  Siente el vientre ms duro o ms grande de lo normal (est hinchado).  Siente un dolor muy intenso en el vientre.  Se siente mareado o se desmaya. Esta informacin no tiene Marine scientist el consejo del mdico. Asegrese de hacerle al mdico cualquier pregunta que tenga. Document Released: 10/29/2010 Document Revised: 12/28/2016 Document Reviewed: 03/16/2016 Elsevier Interactive Patient Education  2018 [place] distasis recti es cuando los msculos del abdomen (msculos del recto abdominis) se vuelven delgados y se separan. El resultado es un espacio ms amplio entre los msculos derecho e izquierdo del abdomen (abdominal). Este espacio ms amplio [employer] msculos puede causar una protuberancia en el centro del abdomen. Usted puede notar esta protuberancia cuando est tensando o cuando se sienta  desde una posicin acostada.  La distasis recti puede afectar a hombres y mujeres. Es ms comn entre mujeres [place], [place], personas obesas y Engineer, manufacturing que se han sometido a una ciruga abdominal. El ejercicio o el tratamiento quirrgico pueden ayudar a Radiation protection practitioner. Cules son las causas? Las causas comunes de esta afeccin incluyen: ? Media planner. El tero en crecimiento ejerce presin sobre los msculos abdominales, lo que hace que los  msculos se separen. ? Obesidad. El exceso de grasa ejerce presin ArvinMeritor msculos abdominales. ? Halterofilia. ? Algunos ejercicios del abdomen. ? Edad avanzada. ? Gentica. ? Ciruga abdominal previa.  Qu aumenta el riesgo? Esta afeccin es ms probable que se desarrolle en: ? Mujeres. ? Los recin nacidos, Nationwide Mutual Insurance recin nacidos que nacen temprano (prematuramente). Cules son los signos o sntomas? Los sntomas comunes de esta afeccin incluyen: ? Una protuberancia en el medio del abdomen. Lo notars ms cuando te sentes o te esfuerces. ? Dolor en la espalda baja, pelvis o caderas. ? Estreimiento. ? Incapacidad para controlar al orinar (incontinencia urinaria). ? Hinchazn. ? Mala Theotis Barrio.  Cmo se diagnostica esto? Esta afeccin se diagnostica con un examen fsico. Su proveedor de atencin [place] pedir que se acueste boca arriba y se sienten o se sienten a media sesin. Si tienes distasis recti, aparecer una protuberancia vertical entre los msculos abdominales en el centro del abdomen. Su proveedor de atencin mdica medir la brecha entre los msculos con uno de los siguientes: ? Un dispositivo mdico utilizado para medir el espacio entre dos objetos (calibrador). ? Herma Ard. ? Tomografa computarizada. ? Ultrasonido. ? Espacios para los dedos. Su proveedor de atencin mdica medir el espacio con sus dedos. Cmo se trata esto? Si la separacin muscular no es demasiado grande, es posible que no necesites tratamiento. Sin embargo, si usted es una mujer que planea quedar embarazada de [place] tratar esta afeccin antes de su prximo embarazo. El tratamiento puede incluir: ? Fisioterapia para fortalecer y tensar los msculos abdominales. ? Cambios en el estilo de vida, como la prdida de peso y Adult nurse. ? Medicamentos para Conservation officer, historic buildings de venta libre segn sea necesario. ? Ciruga para corregir la separacin.  Siga estas instrucciones en  casa: Actividad ? Regrese a sus Surveyor, minerals segn lo le diga su proveedor de Geophysical data processor. Pregntele a su proveedor de atencin mdica qu actividades son seguras para usted. ? Al levantar pesas o hacer ejercicios usando los msculos abdominales o los msculos en el centro del cuerpo que dan estabilidad (msculos centrales), asegrate de estar haciendo tus ejercicios y movimientos correctamente. La forma adecuada puede ayudar a prevenir que la afeccin vuelva a Investment banker, corporate. Instrucciones generales ? Si tienes sobrepeso, pdele ayuda a tu proveedor de atencin mdica con la prdida de peso. Perder incluso una pequea cantidad de peso puede ayudar a mejorar la distasis recti. ? Tome medicamentos de venta libre o recetados solo segn lo dicho por su proveedor de Geophysical data processor. ? No te esfuerces. El esfuerzo puede Sprint Nextel Corporation separacin. Algunos ejemplos de esfuerzo son:  o Control and instrumentation engineer duro para tener un movimiento intestinal, como debido al estreimiento.  o Levantar objetos pesados, incluidos los nios.  o De pie y sentado. ? Tome medidas para prevenir el estreimiento:  o Beba suficiente lquido para Consulting civil engineer orina clara o de color amarillo plido.  o Tome medicamentos de [place] solo segn las instrucciones.  o Coma alimentos con alto contenido de [place], como frutas y verduras frescas, cereales  integrales y frijoles.  o Limite los alimentos ricos en grasas y azcares procesados, como los fritos y los alimentos dulces. Comunquese con un proveedor de atencin mdica si: ? Notas una nueva protuberancia en el abdomen. Obtenga ayuda de inmediato si: ? Experimenta molestias graves en el abdomen. ? Presenta dolor abdominal intenso junto con nuseas, vmitos o fiebre. Resumen ? La distasis recti es cuando los msculos del abdomen (abdominal) se vuelven delgados y separados. El abdomen sobresaldr porque el espacio entre los msculos derecho e izquierdo del abdomen se ha  ensanchado. ? El sntoma ms comn es una protuberancia en el abdomen. Lo notars ms cuando te sentes o ests tensando. ? Esta afeccin se diagnostica durante un examen fsico. ? Si la separacin del abdomen no es demasiado grande, puede optar por no Electrical engineer. De lo contrario, es posible que debas someterte a terapia fsica o [place]. Esta informacin no est destinada a reemplazar los consejos que le haya dado su proveedor de atencin mdica. Asegrese de discutir cualquier pregunta que tenga con su proveedor de Geophysical data processor. Documento publicado: 78/46/9629 Documento revisado: 11/21/2016 Documento revisado: 11/21/2016 Elsevier Interactive Patient Education  2018 [place] capsules Qu es este medicamento? El DOCUSATO es un ablandador fecal. Ayuda a prevenir el estreimiento y los esfuerzos para defecar o las molestias asociadas con las heces [place]. Este medicamento puede ser utilizado para otros usos; si tiene alguna pregunta consulte con su proveedor de atencin mdica o con su farmacutico. MARCAS COMUNES: Colace, Colace Clear, Correctol, D.O.S., DC, Doc-Q-Lace, DocuLace, Docusoft S, DOK, DOK Extra Strength, Dulcolax, Genasoft, Kao-Tin, Kaopectate Liqui-Gels, Phillips Stool Softener, Stool Softener, Stool Softner DC, Sulfolax, Sur-Q-Lax, Surfak, Uni-Ease [employer] Corporation a mi profesional de la salud antes de tomar este medicamento? Necesita saber si usted presenta alguno de los siguientes problemas o situaciones: -nuseas o vmito -estreimiento severo -dolor de estmago -cambio repentino en el hbito intestinal que dura ms de 2 semanas -una reaccin alrgica o inusual al docusato, otros medicamentos, alimentos, colorantes o conservantes -si est embarazada o buscando quedar embarazada -si est amamantando a un beb Cmo debo utilizar este medicamento? Tome este medicamento por va oral con un vaso de agua. Siga las instrucciones de la etiqueta  del [place]. Tome sus dosis a intervalos regulares. No tome su medicamento con una frecuencia mayor a la indicada. Hable con su pediatra para informarse acerca del uso de este medicamento en nios. Aunque este medicamento ha sido recetado a nios tan menores como de 2 aos de edad para condiciones selectivas, las precauciones se aplican. Sobredosis: Pngase en contacto inmediatamente con un centro toxicolgico o una sala de urgencia si usted cree que haya tomado demasiado medicamento. ATENCIN: ConAgra Foods es solo para usted. No comparta este medicamento con nadie. Qu sucede si me olvido de una dosis? Si olvida una dosis, tmela lo antes posible. Si es casi la hora de la prxima dosis, tome slo esa dosis. No tome dosis adicionales o dobles. Qu puede interactuar con este medicamento? -aceite mineral Puede ser que esta lista no menciona todas las posibles interacciones. Informe a su profesional de KB Home	[place] de AES Corporation productos a base de hierbas, medicamentos de [place] o suplementos nutritivos que est tomando. Si usted fuma, consume bebidas alcohlicas o si utiliza drogas ilegales, indqueselo tambin a su profesional de KB Home	[place]. Algunas sustancias pueden interactuar con su medicamento. A qu debo estar atento al usar Coca-Cola? No lo utilice durante ms de una semana sin  consultar a su mdico o a su profesional de KB Home	[place]. Consulte a su mdico o a su profesional de la salud si el estreimiento reaparece. Beba agua en abundancia mientras est tomando este medicamento para ayudar a Scientist, research (physical sciences). Deje de usar este medicamento y comunquese con su mdico o su profesional de la salud si experimenta sangrado rectal o no evacua los intestinos despus de usar. stos pueden ser sntomas de una enfermedad ms grave. Qu efectos secundarios puedo tener al Masco Corporation este medicamento? Efectos secundarios que debe informar a su mdico o a Barrister's clerk de la salud tan  pronto como sea posible: -reacciones alrgicas como erupcin cutnea, picazn o urticarias, hinchazn de la cara, labios o lengua Efectos secundarios que, por lo general, no requieren atencin mdica (debe informarlos a su mdico o a su profesional de la salud si persisten o si son molestos): -diarrea -calambres estomacales -irritacin de la garganta Puede ser que esta lista no menciona todos los posibles efectos secundarios. Comunquese a su mdico por asesoramiento mdico Humana Inc. Usted puede informar los efectos secundarios a la FDA por telfono al 1-800-FDA-1088. Dnde debo guardar mi medicina? Mantngala fuera del alcance de los nios. Gurdela a FPL Group, entre 15 y 1 grados C (67 y 40 grados F). Deseche todo el medicamento que no haya utilizado, despus de la fecha de vencimiento. ATENCIN: Este folleto es un resumen. Puede ser que no cubra toda la posible informacin. Si usted tiene preguntas acerca de esta medicina, consulte con su mdico, su farmacutico o su profesional de Technical sales engineer.  2018 Elsevier/Gold Standard (2014-11-18 00:00:00)

## 2018-08-17 ENCOUNTER — Encounter: Payer: Self-pay | Admitting: Surgery

## 2018-08-17 DIAGNOSIS — K5900 Constipation, unspecified: Secondary | ICD-10-CM | POA: Insufficient documentation

## 2018-08-17 DIAGNOSIS — E079 Disorder of thyroid, unspecified: Secondary | ICD-10-CM | POA: Insufficient documentation

## 2018-08-17 DIAGNOSIS — K429 Umbilical hernia without obstruction or gangrene: Secondary | ICD-10-CM | POA: Insufficient documentation

## 2018-08-17 DIAGNOSIS — K4091 Unilateral inguinal hernia, without obstruction or gangrene, recurrent: Secondary | ICD-10-CM | POA: Insufficient documentation

## 2018-08-17 DIAGNOSIS — I10 Essential (primary) hypertension: Secondary | ICD-10-CM | POA: Insufficient documentation

## 2018-08-17 DIAGNOSIS — E785 Hyperlipidemia, unspecified: Secondary | ICD-10-CM | POA: Insufficient documentation

## 2018-08-17 HISTORY — DX: Umbilical hernia without obstruction or gangrene: K42.9

## 2018-08-17 HISTORY — DX: Unilateral inguinal hernia, without obstruction or gangrene, recurrent: K40.91

## 2018-08-22 ENCOUNTER — Ambulatory Visit (INDEPENDENT_AMBULATORY_CARE_PROVIDER_SITE_OTHER): Payer: Self-pay | Admitting: Gastroenterology

## 2018-08-22 ENCOUNTER — Other Ambulatory Visit: Payer: Self-pay

## 2018-08-22 ENCOUNTER — Ambulatory Visit: Payer: Self-pay | Admitting: Family Medicine

## 2018-08-22 ENCOUNTER — Encounter: Payer: Self-pay | Admitting: Gastroenterology

## 2018-08-22 VITALS — BP 150/94 | HR 96 | Wt 194.6 lb

## 2018-08-22 DIAGNOSIS — R194 Change in bowel habit: Secondary | ICD-10-CM

## 2018-08-22 DIAGNOSIS — R140 Abdominal distension (gaseous): Secondary | ICD-10-CM

## 2018-08-22 DIAGNOSIS — Z1211 Encounter for screening for malignant neoplasm of colon: Secondary | ICD-10-CM

## 2018-08-22 DIAGNOSIS — R300 Dysuria: Secondary | ICD-10-CM

## 2018-08-22 LAB — URINALYSIS, COMPLETE
Bilirubin, UA: NEGATIVE
Glucose, UA: NEGATIVE
KETONES UA: NEGATIVE
Leukocytes, UA: NEGATIVE
NITRITE UA: NEGATIVE
Protein, UA: NEGATIVE
SPEC GRAV UA: 1.01 (ref 1.005–1.030)
UUROB: 0.2 mg/dL (ref 0.2–1.0)
pH, UA: 5 (ref 5.0–7.5)

## 2018-08-22 NOTE — Progress Notes (Signed)
Patient presents today with dysuria and urinary frequency. His symptoms have ongoing for a couple weeks. Patient states he has not been on ABX or had any Urological surgeries in the last 30 days. A urine was collected for UA, UCX. I reviewed the UA and at this time we will wait for UCX to come back. The UA looks normal today.

## 2018-08-22 NOTE — Progress Notes (Signed)
Cephas Darby, MD [address]  [place]  [place], [place] [address]  Main: [phone]  Fax: [phone]    Gastroenterology Consultation  Referring Provider:     Vickie Epley, MD Primary Care Physician:  Antony Blackbird, MD Primary Gastroenterologist:  Dr. Cephas Darby Reason for Consultation:     Colon cancer screening        HPI:   [name] is a 62 y.o. male referred by Dr. Antony Blackbird, MD  for consultation & management. He was suffering from constipation until 2 years ago. Sine then, his BMs are more frequent, upto 4-5 times/day, nonbloody, variable consistency. A/w abdominal bloating, no weight loss, LOA. He also has problem with frequent urination, seen by urology on 08/09/18. Undergoing work up. He is seen by Dr Rosana Hoes, surgeon for repair of umbilical hernia and right inguinal hernia. Referred here to discuss about colonoscopy as he is overdue for cancer screening.   NSAIDs: none  Antiplts/Anticoagulants/Anti thrombotics: none  GI Procedures: none He denies family history of GI malignancy He had a hernia repair in 1990s   Past Medical History:  Diagnosis Date  . 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 Anxiety   . Chest pain 08/06/2012  . Depression   . ED (erectile dysfunction)   . Hyperlipidemia   . Hypertension   . Thyroid disease     Past Surgical History:  Procedure Laterality Date  . HERNIA REPAIR Right 1990   right inguinal hernia, done in [place]  . I&D EXTREMITY Left 10/26/2013   Procedure: IRRIGATION AND DEBRIDEMENT EXTREMITY AND REPAIR AS NECESSARY;  Surgeon: Roseanne Kaufman, MD;  Location: [facility];  Service: Orthopedics;  Laterality: Left;  . INGUINAL HERNIA REPAIR  10/23/2012   Procedure: LAPAROSCOPIC INGUINAL HERNIA;  Surgeon: Ralene Ok, MD;  Location: [facility];  Service: General;  Laterality: Left;  . INSERTION OF MESH  10/23/2012   Procedure: INSERTION OF MESH;  Surgeon: Ralene Ok, MD;  Location: [facility];  Service:  General;  Laterality: Left;  . NERVE, TENDON AND ARTERY REPAIR Left 10/29/2013   Procedure: IRRIGATION AND DEBRIDMENT LEFT HAND AND BURYING RADIAL DIGITAL NERVE TO LEFT THUMB;  Surgeon: Roseanne Kaufman, MD;  Location: [facility];  Service: Orthopedics;  Laterality: Left;    Current Outpatient Medications:  .  mirabegron ER (MYRBETRIQ) 25 MG TB24 tablet, Take 1 tablet (25 mg total) by mouth daily., Disp: 1 tablet, Rfl: 0 .  nystatin-triamcinolone ointment (MYCOLOG), Apply 1 application topically 2 (two) times daily., Disp: 30 g, Rfl: 0 .  sulfamethoxazole-trimethoprim (BACTRIM DS,SEPTRA DS) 800-160 MG tablet, Take 1 tablet by mouth 2 (two) times daily., Disp: , Rfl:  .  tamsulosin (FLOMAX) 0.4 MG CAPS capsule, Take 1 capsule (0.4 mg total) by mouth daily., Disp: 30 capsule, Rfl: 3   Family History  Problem Relation Age of Onset  . Heart disease Unknown        No family history   . Diabetes Mother      Social History   Tobacco Use  . Smoking status: Former Smoker    Packs/day: 2.00    Years: 16.00    Pack years: 32.00    Last attempt to quit: 10/10/1992    Years since quitting: 25.8  . Smokeless tobacco: Never Used  Substance Use Topics  . Alcohol use: Not Currently    Comment: reports glass of wine once a month  . Drug use: No    Allergies as of 08/22/2018  . (  No Known Allergies)    Review of Systems:    All systems reviewed and negative except where noted in HPI.   Physical Exam:  BP (!) 150/94   Pulse 96   Wt 194 lb 9.6 oz (88.3 kg)   BMI 32.38 kg/m  No LMP for male patient.  General:   Alert,  Well-developed, well-nourished, pleasant and cooperative in NAD Head:  Normocephalic and atraumatic. Eyes:  Sclera clear, no icterus.   Conjunctiva pink. Ears:  Normal auditory acuity. Nose:  No deformity, discharge, or lesions. Mouth:  No deformity or lesions,oropharynx pink & moist. Neck:  Supple; no masses or thyromegaly. Lungs:  Respirations even and unlabored.  Clear  throughout to auscultation.   No wheezes, crackles, or rhonchi. No acute distress. Heart:  Regular rate and rhythm; no murmurs, clicks, rubs, or gallops. Abdomen:  Normal bowel sounds. Soft, non-tender and mildly distended, tympanic without masses, hepatosplenomegaly or hernias noted.  No guarding or rebound tenderness.   Rectal: Not performed Msk:  Symmetrical without gross deformities. Good, equal movement & strength bilaterally. Pulses:  Normal pulses noted. Extremities:  No clubbing or edema.  No cyanosis. Neurologic:  Alert and oriented x3;  grossly normal neurologically. Skin:  Intact without significant lesions or rashes. No jaundice. Lymph Nodes:  No significant cervical adenopathy. Psych:  Alert and cooperative. Normal mood and affect.  Imaging Studies: None  Assessment and Plan:   [name] is a 62 y.o. Spanish-speaking male with history of hypertension, hyperlipidemia seen in consultation to discuss about colonoscopy for colon cancer screening as well as 2 years history of increased bowel frequency with abdominal bloating  Diarrhea with bloating Perform EGD and colonoscopy with biopsies  Colon cancer screening, overdue Discuss about colonoscopy and patient agreeable  I have discussed alternative options, risks & benefits,  which include, but are not limited to, bleeding, infection, perforation,respiratory complication & drug reaction.  The patient agrees with this plan & written consent will be obtained.    Follow up after the above work-up   Cephas Darby, MD

## 2018-08-23 ENCOUNTER — Encounter: Payer: Self-pay | Admitting: *Deleted

## 2018-08-23 ENCOUNTER — Encounter: Payer: Self-pay | Admitting: Anesthesiology

## 2018-08-23 ENCOUNTER — Other Ambulatory Visit: Payer: Self-pay

## 2018-08-24 ENCOUNTER — Other Ambulatory Visit: Payer: Self-pay

## 2018-08-24 ENCOUNTER — Ambulatory Visit: Admission: RE | Admit: 2018-08-24 | Payer: Self-pay | Source: Ambulatory Visit

## 2018-08-25 LAB — CULTURE, URINE COMPREHENSIVE

## 2018-08-27 ENCOUNTER — Telehealth: Payer: Self-pay

## 2018-08-27 MED ORDER — CIPROFLOXACIN HCL 500 MG PO TABS: 500.0000 mg | ORAL_TABLET | Freq: Two times a day (BID) | ORAL | 0 refills | Status: DC

## 2018-08-27 NOTE — Telephone Encounter (Signed)
-----   Message from Nori Riis, PA-C sent at 08/27/2018  8:19 AM EST ----- Please let Mr. Jerilee Hoh know that his urine culture was positive.  He needs to start Cipro 500 mg, twice daily for seven days.

## 2018-08-27 NOTE — Telephone Encounter (Signed)
ID # [id number]  Patient notified of results through interpreter line

## 2018-08-27 NOTE — Telephone Encounter (Signed)
Patient contacted office to cancel procedures scheduled with Dr. Marius Ditch at [facility] on 08/29/18 due to being sick.  He will contact office when he is ready to reschedule.    Debbie at [facility] has been informed to cancel patients procedures.  Thanks [employer]

## 2018-08-28 ENCOUNTER — Other Ambulatory Visit: Payer: Self-pay

## 2018-08-28 DIAGNOSIS — R140 Abdominal distension (gaseous): Secondary | ICD-10-CM

## 2018-08-28 DIAGNOSIS — Z1211 Encounter for screening for malignant neoplasm of colon: Secondary | ICD-10-CM

## 2018-08-29 ENCOUNTER — Other Ambulatory Visit: Payer: Self-pay

## 2018-08-29 ENCOUNTER — Ambulatory Visit: Admit: 2018-08-29 | Payer: Self-pay | Admitting: Gastroenterology

## 2018-08-29 ENCOUNTER — Telehealth: Payer: Self-pay

## 2018-08-29 ENCOUNTER — Ambulatory Visit: Admission: RE | Admit: 2018-08-29 | Payer: Self-pay | Source: Ambulatory Visit | Admitting: Gastroenterology

## 2018-08-29 DIAGNOSIS — R140 Abdominal distension (gaseous): Secondary | ICD-10-CM

## 2018-08-29 DIAGNOSIS — Z1211 Encounter for screening for malignant neoplasm of colon: Secondary | ICD-10-CM

## 2018-08-29 SURGERY — ESOPHAGOGASTRODUODENOSCOPY (EGD) WITH PROPOFOL
Anesthesia: Choice

## 2018-08-29 NOTE — Telephone Encounter (Signed)
Patient contacted to inform him that the location for his colonoscopy is [facility] on Dec 5th not [facility] as previously informed.

## 2018-08-30 ENCOUNTER — Ambulatory Visit: Payer: Self-pay | Admitting: Urology

## 2018-09-12 ENCOUNTER — Other Ambulatory Visit: Payer: Self-pay

## 2018-09-12 MED ORDER — PEG 3350-KCL-NABCB-NACL-NASULF 236 G PO SOLR: 4000.0000 mL | Freq: Once | ORAL | 0 refills | Status: AC

## 2018-09-13 ENCOUNTER — Ambulatory Visit: Admit: 2018-09-13 | Payer: Self-pay | Admitting: Gastroenterology

## 2018-09-13 ENCOUNTER — Encounter
Admission: RE | Disposition: A | Discharge status: Home / Self Care | Payer: Self-pay | Source: Ambulatory Visit | Attending: Gastroenterology

## 2018-09-13 ENCOUNTER — Ambulatory Visit: Payer: Self-pay | Admitting: Anesthesiology

## 2018-09-13 ENCOUNTER — Ambulatory Visit
Admission: RE | Admit: 2018-09-13 | Discharge: 2018-09-13 | Disposition: A | Discharge status: Home / Self Care | Payer: Self-pay | Source: Ambulatory Visit | Attending: Gastroenterology | Admitting: Gastroenterology

## 2018-09-13 DIAGNOSIS — R1013 Epigastric pain: Secondary | ICD-10-CM | POA: Insufficient documentation

## 2018-09-13 DIAGNOSIS — I10 Essential (primary) hypertension: Secondary | ICD-10-CM | POA: Insufficient documentation

## 2018-09-13 DIAGNOSIS — Q453 Other congenital malformations of pancreas and pancreatic duct: Secondary | ICD-10-CM | POA: Insufficient documentation

## 2018-09-13 DIAGNOSIS — K529 Noninfective gastroenteritis and colitis, unspecified: Secondary | ICD-10-CM

## 2018-09-13 DIAGNOSIS — R197 Diarrhea, unspecified: Secondary | ICD-10-CM | POA: Insufficient documentation

## 2018-09-13 DIAGNOSIS — Z87891 Personal history of nicotine dependence: Secondary | ICD-10-CM | POA: Insufficient documentation

## 2018-09-13 DIAGNOSIS — D120 Benign neoplasm of cecum: Secondary | ICD-10-CM

## 2018-09-13 DIAGNOSIS — K319 Disease of stomach and duodenum, unspecified: Secondary | ICD-10-CM | POA: Insufficient documentation

## 2018-09-13 DIAGNOSIS — R140 Abdominal distension (gaseous): Secondary | ICD-10-CM

## 2018-09-13 DIAGNOSIS — D122 Benign neoplasm of ascending colon: Secondary | ICD-10-CM | POA: Insufficient documentation

## 2018-09-13 DIAGNOSIS — Z1211 Encounter for screening for malignant neoplasm of colon: Secondary | ICD-10-CM | POA: Insufficient documentation

## 2018-09-13 DIAGNOSIS — K3189 Other diseases of stomach and duodenum: Secondary | ICD-10-CM

## 2018-09-13 HISTORY — PX: ESOPHAGOGASTRODUODENOSCOPY (EGD) WITH PROPOFOL: SHX5813

## 2018-09-13 HISTORY — DX: Unspecified urinary incontinence: R32

## 2018-09-13 HISTORY — PX: COLONOSCOPY WITH PROPOFOL: SHX5780

## 2018-09-13 SURGERY — COLONOSCOPY WITH PROPOFOL
Anesthesia: Choice

## 2018-09-13 SURGERY — COLONOSCOPY WITH PROPOFOL
Anesthesia: General

## 2018-09-13 MED ORDER — MIDAZOLAM HCL 2 MG/2ML IJ SOLN
INTRAMUSCULAR | Status: DC | PRN
  Administered 2018-09-13: 1 mg via INTRAVENOUS

## 2018-09-13 MED ORDER — SODIUM CHLORIDE 0.9 % IV SOLN
INTRAVENOUS | Status: DC
  Administered 2018-09-13: 11:00:00 via INTRAVENOUS

## 2018-09-13 MED ORDER — PROPOFOL 10 MG/ML IV BOLUS
INTRAVENOUS | Status: DC | PRN
  Administered 2018-09-13 (×4): 44 mg via INTRAVENOUS

## 2018-09-13 MED ORDER — FENTANYL CITRATE (PF) 100 MCG/2ML IJ SOLN
INTRAMUSCULAR | Status: AC
  Filled 2018-09-13: qty 2

## 2018-09-13 MED ORDER — MIDAZOLAM HCL 2 MG/2ML IJ SOLN
INTRAMUSCULAR | Status: AC
  Filled 2018-09-13: qty 2

## 2018-09-13 MED ORDER — PHENYLEPHRINE HCL 10 MG/ML IJ SOLN
INTRAMUSCULAR | Status: DC | PRN
  Administered 2018-09-13 (×2): 100 ug via INTRAVENOUS

## 2018-09-13 MED ORDER — PROPOFOL 500 MG/50ML IV EMUL
INTRAVENOUS | Status: DC | PRN
  Administered 2018-09-13: 140 ug/kg/min via INTRAVENOUS

## 2018-09-13 MED ORDER — EPHEDRINE SULFATE 50 MG/ML IJ SOLN
INTRAMUSCULAR | Status: DC | PRN
  Administered 2018-09-13: 10 mg via INTRAVENOUS

## 2018-09-13 MED ORDER — LIDOCAINE HCL (PF) 2 % IJ SOLN
INTRAMUSCULAR | Status: AC
  Filled 2018-09-13: qty 10

## 2018-09-13 MED ORDER — FENTANYL CITRATE (PF) 100 MCG/2ML IJ SOLN
INTRAMUSCULAR | Status: DC | PRN
  Administered 2018-09-13: 50 ug via INTRAVENOUS

## 2018-09-13 MED ORDER — PROPOFOL 500 MG/50ML IV EMUL
INTRAVENOUS | Status: AC
  Filled 2018-09-13: qty 50

## 2018-09-13 NOTE — Op Note (Signed)
[facility] Gastroenterology Patient Name: [name] Procedure Date: 09/13/2018 11:32 AM MRN: [id number] Account #: [id number] Date of Birth: 1956-09-14 Admit Type: Outpatient Age: 62 Room: [facility] ENDO ROOM 2 Gender: Male Note Status: Finalized Procedure:            Upper GI endoscopy Indications:          Epigastric abdominal pain, Diarrhea Providers:            Lin Landsman MD, MD Referring MD:         Antony Blackbird (Referring MD) Medicines:            Monitored Anesthesia Care Complications:        No immediate complications. Estimated blood loss:                        Minimal. Procedure:            Pre-Anesthesia Assessment:                       - Prior to the procedure, a History and Physical was                        performed, and patient medications and allergies were                        reviewed. The patient is competent. The risks and                        benefits of the procedure and the sedation options and                        risks were discussed with the patient. All questions                        were answered and informed consent was obtained.                        Patient identification and proposed procedure were                        verified by the physician, the nurse, the                        anesthesiologist, the anesthetist and the technician in                        the pre-procedure area in the procedure room in the                        endoscopy suite. Mental Status Examination: alert and                        oriented. Airway Examination: normal oropharyngeal                        airway and neck mobility. Respiratory Examination:                        clear to auscultation. CV Examination: normal.  Prophylactic Antibiotics: The patient does not require                        prophylactic antibiotics. Prior Anticoagulants: The                        patient has taken no previous  anticoagulant or                        antiplatelet agents. ASA Grade Assessment: II - A                        patient with mild systemic disease. After reviewing the                        risks and benefits, the patient was deemed in                        satisfactory condition to undergo the procedure. The                        anesthesia plan was to use monitored anesthesia care                        (MAC). Immediately prior to administration of                        medications, the patient was re-assessed for adequacy                        to receive sedatives. The heart rate, respiratory rate,                        oxygen saturations, blood pressure, adequacy of                        pulmonary ventilation, and response to care were                        monitored throughout the procedure. The physical status                        of the patient was re-assessed after the procedure.                       After obtaining informed consent, the endoscope was                        passed under direct vision. Throughout the procedure,                        the patient's blood pressure, pulse, and oxygen                        saturations were monitored continuously. The Endoscope                        was introduced through the mouth, and advanced to the  second part of duodenum. The upper GI endoscopy was                        accomplished without difficulty. The patient tolerated                        the procedure well. Findings:      The duodenal bulb and second portion of the duodenum were normal.       Biopsies for histology were taken with a cold forceps for evaluation of       celiac disease.      Diffuse mildly erythematous mucosa without bleeding was found in the       gastric antrum. Biopsies were taken with a cold forceps for Helicobacter       pylori testing.      A single umbilicated lesion was found in the gastric antrum.      Diffuse  mildly erythematous mucosa without bleeding was found in the       gastric body. Biopsies were taken with a cold forceps for Helicobacter       pylori testing.      Esophagogastric landmarks were identified: the gastroesophageal junction       was found at 36 cm from the incisors.      The examined esophagus was normal. Impression:           - Normal duodenal bulb and second portion of the                        duodenum. Biopsied.                       - Erythematous mucosa in the antrum. Biopsied.                       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                       - Erythematous mucosa in the gastric body. Biopsied.                       - Esophagogastric landmarks identified.                       - Normal esophagus. Recommendation:       - Await pathology results.                       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 All rights reserved. The codes documented in this report are preliminary and upon coder review may  be revised to meet current compliance requirements. Dr. Ulyess Mort Lin Landsman MD, MD 09/13/2018 11:58:57 AM This report has been signed electronically. Number of Addenda: 0 Note Initiated On: 09/13/2018 11:32 AM      [facility]

## 2018-09-13 NOTE — Anesthesia Preprocedure Evaluation (Signed)
Anesthesia Evaluation  Patient identified by MRN, date of birth, ID band Patient awake    Reviewed: Allergy & Precautions, NPO status , Patient's Chart, lab work & pertinent test results  History of Anesthesia Complications Negative for: history of anesthetic complications  Airway Mallampati: III       Dental   Pulmonary neg sleep apnea, neg COPD, former smoker,           Cardiovascular (-) hypertension(-) Past MI and (-) CHF (-) dysrhythmias (-) Valvular Problems/Murmurs     Neuro/Psych Anxiety Depression    GI/Hepatic Neg liver ROS, GERD  ,  Endo/Other  neg diabetes  Renal/GU negative Renal ROS     Musculoskeletal   Abdominal   Peds  Hematology   Anesthesia Other Findings   Reproductive/Obstetrics                            Anesthesia Physical Anesthesia Plan  ASA: II  Anesthesia Plan: General   Post-op Pain Management:    Induction: Intravenous  PONV Risk Score and Plan: 2 and Propofol infusion and TIVA  Airway Management Planned: Nasal Cannula  Additional Equipment:   Intra-op Plan:   Post-operative Plan:   Informed Consent: I have reviewed the patients History and Physical, chart, labs and discussed the procedure including the risks, benefits and alternatives for the proposed anesthesia with the patient or authorized representative who has indicated his/her understanding and acceptance.     Plan Discussed with:   Anesthesia Plan Comments:         Anesthesia Quick Evaluation

## 2018-09-13 NOTE — Op Note (Signed)
[facility] Gastroenterology Patient Name: [name] Procedure Date: 09/13/2018 11:31 AM MRN: [id number] Account #: [id number] Date of Birth: [birth year] Admit Type: Outpatient Age: 61 Room: [facility] ENDO ROOM 2 Gender: Male Note Status: Finalized Procedure:            Colonoscopy Indications:          Screening for colorectal malignant neoplasm, This is                        the patient's first colonoscopy Providers:            Lin Landsman MD, MD Medicines:            Monitored Anesthesia Care Complications:        No immediate complications. Estimated blood loss: None. Procedure:            Pre-Anesthesia Assessment:                       - Prior to the procedure, a History and Physical was                        performed, and patient medications and allergies were                        reviewed. The patient is competent. The risks and                        benefits of the procedure and the sedation options and                        risks were discussed with the patient. All questions                        were answered and informed consent was obtained.                        Patient identification and proposed procedure were                        verified by the physician, the nurse, the                        anesthesiologist, the anesthetist and the technician in                        the pre-procedure area in the procedure room in the                        endoscopy suite. Mental Status Examination: alert and                        oriented. Airway Examination: normal oropharyngeal                        airway and neck mobility. Respiratory Examination:                        clear to auscultation. CV Examination: normal.  Prophylactic Antibiotics: The patient does not require                        prophylactic antibiotics. Prior Anticoagulants: The                        patient has taken no previous anticoagulant  or                        antiplatelet agents. ASA Grade Assessment: II - A                        patient with mild systemic disease. After reviewing the                        risks and benefits, the patient was deemed in                        satisfactory condition to undergo the procedure. The                        anesthesia plan was to use monitored anesthesia care                        (MAC). Immediately prior to administration of                        medications, the patient was re-assessed for adequacy                        to receive sedatives. The heart rate, respiratory rate,                        oxygen saturations, blood pressure, adequacy of                        pulmonary ventilation, and response to care were                        monitored throughout the procedure. The physical status                        of the patient was re-assessed after the procedure.                       After obtaining informed consent, the colonoscope was                        passed under direct vision. Throughout the procedure,                        the patient's blood pressure, pulse, and oxygen                        saturations were monitored continuously. The                        Colonoscope was introduced through the anus and  advanced to the the cecum, identified by appendiceal                        orifice and ileocecal valve. The colonoscopy was                        performed without difficulty. The patient tolerated the                        procedure well. The quality of the bowel preparation                        was evaluated using the BBPS [place] Bowel Preparation                        Scale) with scores of: Right Colon = 3, Transverse                        Colon = 3 and Left Colon = 3 (entire mucosa seen well                        with no residual staining, small fragments of stool or                        opaque liquid). The total  BBPS score equals 9. Findings:      The perianal and digital rectal examinations were normal. Pertinent       negatives include normal sphincter tone and no palpable rectal lesions.      Two sessile polyps were found in the ascending colon and cecum. The       polyps were 6 mm in size. These polyps were removed with a cold snare.       Resection and retrieval were complete.      Normal mucosa was found in the entire colon. Biopsies for histology were       taken with a cold forceps from the entire colon for evaluation of       microscopic colitis.      The retroflexed view of the distal rectum and anal verge was normal and       showed no anal or rectal abnormalities. Impression:           - Two 6 mm polyps in the ascending colon and in the                        cecum, removed with a cold snare. Resected and                        retrieved.                       - Normal mucosa in the entire examined colon. Biopsied.                       - The distal rectum and anal verge are normal on                        retroflexion view. Recommendation:       - Discharge patient to home (with escort).                       -  Resume previous diet today.                       - Continue present medications.                       - Await pathology results.                       - Repeat colonoscopy in 3 - 5 years for surveillance                        based on pathology results.                       - Return to my office as previously scheduled. Procedure Code(s):    --- Professional ---                       [phone], Colonoscopy, flexible; with removal of tumor(s),                        polyp(s), or other lesion(s) by snare technique                       [id number], 32, Colonoscopy, flexible; with biopsy, single                        or multiple Diagnosis Code(s):    --- Professional ---                       Z12.11, Encounter for screening for malignant neoplasm                        of colon                        D12.2, Benign neoplasm of ascending colon                       D12.0, Benign neoplasm of cecum CPT copyright 2018 American Medical Association. All rights reserved. The codes documented in this report are preliminary and upon coder review may  be revised to meet current compliance requirements. Dr. Ulyess Mort Lin Landsman MD, MD 09/13/2018 12:22:03 PM This report has been signed electronically. Number of Addenda: 0 Note Initiated On: 09/13/2018 11:31 AM Scope Withdrawal Time: 0 hours 16 minutes 0 seconds  Total Procedure Duration: 0 hours 18 minutes 40 seconds       [facility]

## 2018-09-13 NOTE — H&P (Signed)
Cephas Darby, MD [address]  [place]  [place], [place] [address]  Main: [phone]  Fax: [phone] Pager: [phone]  Primary Care Physician:  Antony Blackbird, MD Primary Gastroenterologist:  Dr. Cephas Darby  Pre-Procedure History & Physical: HPI:  [name] is a 62 y.o. male is here for an endoscopy and colonoscopy.   Past Medical History:  Diagnosis Date  . 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 Anxiety   . Depression   . ED (erectile dysfunction)   . Hyperlipidemia   . Hypertension   . Thyroid disease   . Urinary incontinence     Past Surgical History:  Procedure Laterality Date  . HERNIA REPAIR Right 1990   right inguinal hernia, done in [place]  . I&D EXTREMITY Left 10/26/2013   Procedure: IRRIGATION AND DEBRIDEMENT EXTREMITY AND REPAIR AS NECESSARY;  Surgeon: Roseanne Kaufman, MD;  Location: [facility];  Service: Orthopedics;  Laterality: Left;  . INGUINAL HERNIA REPAIR  10/23/2012   Procedure: LAPAROSCOPIC INGUINAL HERNIA;  Surgeon: Ralene Ok, MD;  Location: [facility];  Service: General;  Laterality: Left;  . INSERTION OF MESH  10/23/2012   Procedure: INSERTION OF MESH;  Surgeon: Ralene Ok, MD;  Location: [facility];  Service: General;  Laterality: Left;  . NERVE, TENDON AND ARTERY REPAIR Left 10/29/2013   Procedure: IRRIGATION AND DEBRIDMENT LEFT HAND AND BURYING RADIAL DIGITAL NERVE TO LEFT THUMB;  Surgeon: Roseanne Kaufman, MD;  Location: [facility];  Service: Orthopedics;  Laterality: Left;    Prior to Admission medications   Medication Sig Start Date End Date Taking? Authorizing Provider  ciprofloxacin (CIPRO) 500 MG tablet Take 1 tablet (500 mg total) by mouth every 12 (twelve) hours. 08/27/18  Yes McGowan, [name] A, PA-C  mirabegron ER (MYRBETRIQ) 25 MG TB24 tablet Take 1 tablet (25 mg total) by mouth daily. Patient not taking: Reported on 08/23/2018 08/09/18   Zara Council A, PA-C  nystatin-triamcinolone ointment (MYCOLOG) Apply 1 application  topically 2 (two) times daily. Patient not taking: Reported on 08/23/2018 08/09/18   Zara Council A, PA-C  tamsulosin (FLOMAX) 0.4 MG CAPS capsule Take 1 capsule (0.4 mg total) by mouth daily. Patient not taking: Reported on 08/23/2018 06/14/18   Argentina Donovan, PA-C    Allergies as of 08/29/2018  . (No Known Allergies)    Family History  Problem Relation Age of Onset  . Heart disease Unknown        No family history   . Diabetes Mother     Social History   Socioeconomic History  . Marital status: Single    Spouse name: Not on file  . Number of children: 5  . Years of education: Not on file  . Highest education level: Not on file  Occupational History    Employer: [employer]    Comment: Tile  Social Needs  . Financial resource strain: Not on file  . Food insecurity:    Worry: Not on file    Inability: Not on file  . Transportation needs:    Medical: Not on file    Non-medical: Not on file  Tobacco Use  . Smoking status: Former Smoker    Packs/day: 2.00    Years: 16.00    Pack years: 32.00    Last attempt to quit: 10/10/1992    Years since quitting: 25.9  . Smokeless tobacco: Never Used  Substance and Sexual Activity  . Alcohol use: Never    Frequency: Never  . Drug use:  No  . Sexual activity: Yes  Lifestyle  . Physical activity:    Days per week: Not on file    Minutes per session: Not on file  . Stress: Not on file  Relationships  . Social connections:    Talks on phone: Not on file    Gets together: Not on file    Attends religious service: Not on file    Active member of club or organization: Not on file    Attends meetings of clubs or organizations: Not on file    Relationship status: Not on file  . Intimate partner violence:    Fear of current or ex partner: Not on file    Emotionally abused: Not on file    Physically abused: Not on file    Forced sexual activity: Not on file  Other Topics Concern  . Not on file  Social History  Narrative  . Not on file    Review of Systems: See HPI, otherwise negative ROS  Physical Exam: BP (!) 149/93   Pulse 87   Temp 98 F (36.7 C) (Tympanic)   Resp 18   Ht 5\' 5"  (1.651 m)   Wt 88 kg   SpO2 97%   BMI 32.28 kg/m  General:   Alert,  pleasant and cooperative in NAD Head:  Normocephalic and atraumatic. Neck:  Supple; no masses or thyromegaly. Lungs:  Clear throughout to auscultation.    Heart:  Regular rate and rhythm. Abdomen:  Soft, nontender and nondistended. Normal bowel sounds, without guarding, and without rebound.   Neurologic:  Alert and  oriented x4;  grossly normal neurologically.  Impression/Plan: [name] is here for an endoscopy and colonoscopy to be performed for colon cancer screening and diarrhea  Risks, benefits, limitations, and alternatives regarding  endoscopy and colonoscopy have been reviewed with the patient.  Questions have been answered.  All parties agreeable.   Sherri Sear, MD  09/13/2018, 11:36 AM

## 2018-09-13 NOTE — Anesthesia Postprocedure Evaluation (Signed)
Anesthesia Post Note  Patient: [name]  Procedure(s) Performed: COLONOSCOPY WITH PROPOFOL (N/A ) ESOPHAGOGASTRODUODENOSCOPY (EGD) WITH PROPOFOL (N/A )  Patient location during evaluation: Endoscopy Anesthesia Type: General Level of consciousness: awake and alert Pain management: pain level controlled Vital Signs Assessment: post-procedure vital signs reviewed and stable Respiratory status: spontaneous breathing and respiratory function stable Cardiovascular status: stable Anesthetic complications: no     Last Vitals:  Vitals:   09/13/18 1044 09/13/18 1225  BP: (!) 149/93 (!) 86/58  Pulse: 87 84  Resp: 18 12  Temp: 36.7 C (!) 36.2 C  SpO2: 97% 100%    Last Pain:  Vitals:   09/13/18 1225  TempSrc: Tympanic                 Lander Eslick K

## 2018-09-13 NOTE — Transfer of Care (Signed)
Immediate Anesthesia Transfer of Care Note  Patient: [name]  Procedure(s) Performed: COLONOSCOPY WITH PROPOFOL (N/A ) ESOPHAGOGASTRODUODENOSCOPY (EGD) WITH PROPOFOL (N/A )  Patient Location: PACU and Endoscopy Unit  Anesthesia Type:General  Level of Consciousness: sedated  Airway & Oxygen Therapy: Patient Spontanous Breathing  Post-op Assessment: Report given to RN  Post vital signs: stable  Last Vitals:  Vitals Value Taken Time  BP 86/58 09/13/2018 12:25 PM  Temp    Pulse 83 09/13/2018 12:25 PM  Resp 12 09/13/2018 12:25 PM  SpO2 98 % 09/13/2018 12:25 PM  Vitals shown include unvalidated device data.  Last Pain:  Vitals:   09/13/18 1044  TempSrc: Tympanic         Complications: No apparent anesthesia complications

## 2018-09-13 NOTE — Anesthesia Post-op Follow-up Note (Signed)
Anesthesia QCDR form completed.        

## 2018-09-17 LAB — SURGICAL PATHOLOGY

## 2018-09-18 ENCOUNTER — Encounter: Payer: Self-pay | Admitting: Gastroenterology

## 2018-09-19 ENCOUNTER — Ambulatory Visit: Payer: Self-pay | Admitting: Urology

## 2018-09-19 ENCOUNTER — Encounter: Payer: Self-pay | Admitting: Urology

## 2018-09-20 ENCOUNTER — Other Ambulatory Visit: Payer: Self-pay

## 2018-09-20 ENCOUNTER — Encounter: Payer: Self-pay | Admitting: *Deleted

## 2018-09-20 ENCOUNTER — Ambulatory Visit (INDEPENDENT_AMBULATORY_CARE_PROVIDER_SITE_OTHER): Payer: Self-pay | Admitting: Surgery

## 2018-09-20 ENCOUNTER — Encounter: Payer: Self-pay | Admitting: Surgery

## 2018-09-20 VITALS — BP 128/88 | HR 82 | Temp 98.0°F | Ht 65.0 in | Wt 195.8 lb

## 2018-09-20 DIAGNOSIS — K4091 Unilateral inguinal hernia, without obstruction or gangrene, recurrent: Secondary | ICD-10-CM

## 2018-09-20 DIAGNOSIS — K429 Umbilical hernia without obstruction or gangrene: Secondary | ICD-10-CM

## 2018-09-20 DIAGNOSIS — N419 Inflammatory disease of prostate, unspecified: Secondary | ICD-10-CM | POA: Insufficient documentation

## 2018-09-20 NOTE — Progress Notes (Unsigned)
Patient was seen in the office today with Dr. Rosana Hoes.   The patient needs to be scheduled for lap repair of recurrent right inguinal hernia with mesh possible open repair umbilical hernia. *Dr. Dahlia Byes to assist.   Patient needs to follow up with urology as soon as possible. He has seen Zara Council, PA-C in the past at [place] [facility]. Their office had scheduled him for CT hematuria work up which the patient did not show up for.   CT has been rescheduled to 09-27-18 at [place]. He is aware no solids 4 hours prior but may have clear liquids.  A message has been left for the staff at [facility] to please contact the patient to reschedule office visit with Zara Council.   Once patient has been seen by urology and as long as there is no infection, patient can be scheduled for surgery with Dr. Rosana Hoes. No pre-op visit will be needed with Dr. Rosana Hoes.   Patient notified of the above and verbalizes understanding.   *Patient will require a Spanish interpreter.

## 2018-09-20 NOTE — Progress Notes (Signed)
Surgical Clinic Progress/Follow-up Note   HPI:  62 y.o. Male presents to clinic for follow-up evaluation of his symptomatic Right inguinal and umbilical hernias. Patient recently underwent recommended colonoscopy and returns to discuss and schedule surgical repair of his Right inguinal and umbilical hernias. Patient, however, complains what's been bothering him most is urinary burning and frequency, having had to get up to urinate x3 during just during the course of obtaining his history. Patient says he previously, while in [place], was admitted to a hospital for prostatitis, but needed to be transferred to another hospital, where a Foley catheter was inserted and irrigated with evacuation of clots. It seems patient was evaluated relatively recently by urology, who advised CT imaging, but this was not performed. Patient says his Right inguinal and umbilical hernias continue to cause him discomfort, but his primary complaints are his urinary burning and frequency. He otherwise reports +flatus and +BM's WNL, denies fever/chills, N/V, CP, or SOB.  All of the above history was obtained with the assistance of certified medical Spanish-English translator.  Review of Systems:  Constitutional: denies any other weight loss, fever, chills, or sweats  Eyes: denies any other vision changes, history of eye injury  ENT: denies sore throat, hearing problems  Respiratory: denies shortness of breath, wheezing  Cardiovascular: denies chest pain, palpitations  Gastrointestinal: abdominal pain, N/V, and bowel function as per HPI Musculoskeletal: denies any other joint pains or cramps  Skin: Denies any other rashes or skin discolorations  Neurological: denies any other headache, dizziness, weakness  Psychiatric: denies any other depression, anxiety  All other review of systems: otherwise negative   Vital Signs:  BP 128/88   Pulse 82   Temp 98 F (36.7 C)   Ht 5\' 5"  (1.651 m)   Wt 195 lb 12.8 oz (88.8 kg)    SpO2 97%   BMI 32.58 kg/m    Physical Exam:  Constitutional:  -- Overweight body habitus  -- Awake, alert, and oriented x3  Eyes:  -- Pupils equally round and reactive to light  -- No scleral icterus  Ear, nose, throat:  -- No jugular venous distension  -- No nasal drainage, bleeding Pulmonary:  -- No crackles -- Equal breath sounds bilaterally -- Breathing non-labored at rest Cardiovascular:  -- S1, S2 present  -- No pericardial rubs  Gastrointestinal:  -- Soft, nontender, and non-distended with no guarding/rebound tenderness  -- No abdominal masses appreciated, pulsatile or otherwise, except easily reducible umbilical and recurrent Right inguinal hernias Musculoskeletal / Integumentary:  -- Wounds or skin discoloration: None appreciated  -- Extremities: B/L UE and LE FROM, hands and feet warm, no edema  Neurologic:  -- Motor function: intact and symmetric  -- Sensation: intact and symmetric   Laboratory studies:  CBC Latest Ref Rng & Units 10/26/2013 10/19/2012 03/18/2011  WBC 4.0 - 10.5 K/uL 12.8(H) 9.5 -  Hemoglobin 13.0 - 17.0 g/dL 15.7 16.6 14.3  Hematocrit 39.0 - 52.0 % 43.5 47.2 42.0  Platelets 150 - 400 K/uL 164 160 -   CMP Latest Ref Rng & Units 06/28/2018 06/14/2018 10/26/2013  Glucose 65 - 99 mg/dL 84 76 116(H)  BUN 8 - 27 mg/dL 14 11 16   Creatinine 0.76 - 1.27 mg/dL 0.68(L) 0.79 0.84  Sodium 134 - 144 mmol/L 142 142 144  Potassium 3.5 - 5.2 mmol/L 4.4 4.2 4.0  Chloride 96 - 106 mmol/L 105 103 105  CO2 20 - 29 mmol/L 22 25 26   Calcium 8.6 - 10.2 mg/dL 9.1 9.1 8.5  Total Protein 6.0 - 8.5 g/dL - 7.0 -  Total Bilirubin 0.0 - 1.2 mg/dL - 0.7 -  Alkaline Phos 39 - 117 IU/L - 72 -  AST 0 - 40 IU/L - 20 -  ALT 0 - 44 IU/L - 24 -   Imaging:  Colonoscopy ([place], 09/13/2018) - Two 6 mm polyps in the ascending colon and in the cecum, removed with a cold snare. Resected and retrieved. - Normal mucosa in the entire examined colon. Biopsied. - The distal rectum and  anal verge are normal on retroflexion view.  Colonoscopic Pathology (09/13/2018) A. DUODENUM; COLD BIOPSY:  - ENTERIC MUCOSA WITH PRESERVED VILLOUS ARCHITECTURE AND NO SIGNIFICANT  HISTOPATHOLOGIC CHANGE.  - NEGATIVE FOR FEATURES OF CELIAC, DYSPLASIA, AND MALIGNANCY.   B. STOMACH, ANTRUM; COLD BIOPSY:  - GASTRIC ANTRAL MUCOSA WITH MILD REACTIVE GASTROPATHY.  - NEGATIVE FOR H. PYLORI, DYSPLASIA, AND MALIGNANCY.   C. STOMACH, BODY; COLD BIOPSY:  - GASTRIC OXYNTIC MUCOSA WITH NO SIGNIFICANT HISTOPATHOLOGIC CHANGE.  - NEGATIVE FOR H. PYLORI, DYSPLASIA, AND MALIGNANCY.   D. COLON POLYP, CECUM; COLD SNARE:  - BENIGN COLONIC TISSUE SHOWING SUPERFICIAL HYPERPLASTIC CHANGES.  - NEGATIVE FOR ADENOMATOUS CHANGE AND MALIGNANCY.   E. COLON POLYP, ASCENDING; COLD SNARE:  - TUBULAR ADENOMA.  - NEGATIVE FOR HIGH-GRADE DYSPLASIA AND MALIGNANCY.   F. COLON, RANDOM; COLD BIOPSY:  - BENIGN COLONIC TISSUE WITH NO SIGNIFICANT HISTOPATHOLOGIC CHANGE.  - NEGATIVE FOR MICROSCOPIC COLITIS, DYSPLASIA, AND MALIGNANCY.  Assessment:  62 y.o. yo Male with a problem list including...  Patient Active Problem List   Diagnosis Date Noted  . Abdominal bloating   . Chronic diarrhea   . Colon cancer screening   . Hypertension 08/17/2018  . Thyroid disease 08/17/2018  . Hyperlipidemia 08/17/2018  . Umbilical hernia without obstruction or gangrene 08/17/2018  . Unilateral recurrent inguinal hernia without obstruction or gangrene 08/17/2018  . Constipation 08/17/2018    Assessment/Plan: (ICD-10's: K40.91, K42.9) 62 y.o. malewith increasingly symptomatic (painful) reducible recurrent Right inguinal hernia > non-recurrent umbilical hernia, complicated by chronic symptomatic prostatitis and by comorbidities including obesity (BMI >32), HTN, HLD, thyroid disease (not otherwise specified), erectile dysfunction, generalized anxiety disorder, and major depression disorder.  - discussed with patient signs  and symptoms of hernia incarceration and obstruction - strategies for manual self-reduction of patient's hernia also reviewed and discussed             - maintain hydration with high fiber heart healthy diet to reduce/minimize constipation +/- daily stool softener as needed and Miralax prn for constipation despite stool hydration, fiber, and Colace - all risks, benefits, and alternatives torepair of recurrent Right inguinal and umbilical hernias with meshwere discussed with the patient, all of his questions were answered to patient's expressed satisfaction, patient expresses he wishes to proceed, and informed consent was obtained. - will plan for laparoscopic repair of recurrent Right inguinal hernia with mesh and primary vs mesh repair of umbilical hernia pending anesthesia, OR availability, and urology management of prostatitis to reduce risks of mesh infection if hernia mesh placed in the context of active/ongoing urinary tract infection             - patient does not require another surgical appointment and can schedule surgery pending resolution of potentially infectious urology issues - anticipatereturn to clinic2weeks after above planned surgery - instructed to call if any questions or concerns  All of the above recommendations were discussed with the patient, and all of patient's questions were answered to his  expressed satisfaction.  -- Marilynne Drivers Rosana Hoes, MD, [place]: [facility] - Partnering for exceptional care. Office: [phone]

## 2018-09-20 NOTE — Patient Instructions (Signed)
Will need to see Zara Council and have ordered CT completed prior to scheduling surgery. We will schedule your surgery once you are cleared for infections from urology.

## 2018-09-21 ENCOUNTER — Telehealth: Payer: Self-pay

## 2018-09-21 NOTE — Telephone Encounter (Signed)
Message left with [facility] to see if patient can be scheduled to follow up with Zara Council sooner. He has been rescheduled for his CT that she wanted completed. It is scheduled for 09/27/18. He needs to be cleared by Urology before we can schedule his surgery with Dr Rosana Hoes.

## 2018-09-27 ENCOUNTER — Other Ambulatory Visit (INDEPENDENT_AMBULATORY_CARE_PROVIDER_SITE_OTHER): Payer: Self-pay

## 2018-09-27 ENCOUNTER — Ambulatory Visit
Admission: RE | Admit: 2018-09-27 | Discharge: 2018-09-27 | Disposition: A | Discharge status: Home / Self Care | Payer: Self-pay | Source: Ambulatory Visit | Attending: Urology | Admitting: Urology

## 2018-09-27 DIAGNOSIS — R300 Dysuria: Secondary | ICD-10-CM

## 2018-09-27 DIAGNOSIS — R3129 Other microscopic hematuria: Secondary | ICD-10-CM | POA: Insufficient documentation

## 2018-09-27 DIAGNOSIS — R3911 Hesitancy of micturition: Secondary | ICD-10-CM

## 2018-09-27 DIAGNOSIS — R350 Frequency of micturition: Secondary | ICD-10-CM

## 2018-09-27 LAB — MICROSCOPIC EXAMINATION
Bacteria, UA: NONE SEEN
Epithelial Cells (non renal): NONE SEEN /hpf (ref 0–10)
WBC, UA: NONE SEEN /hpf (ref 0–5)

## 2018-09-27 LAB — URINALYSIS, COMPLETE
BILIRUBIN UA: NEGATIVE
Glucose, UA: NEGATIVE
Ketones, UA: NEGATIVE
Leukocytes, UA: NEGATIVE
Nitrite, UA: NEGATIVE
Protein, UA: NEGATIVE
Specific Gravity, UA: <1.005 — ABNORMAL LOW (ref 1.005–1.030)
Urobilinogen, Ur: 0.2 mg/dL (ref 0.2–1.0)
pH, UA: 5.5 (ref 5.0–7.5)

## 2018-09-27 LAB — POCT I-STAT CREATININE: Creatinine, Ser: 0.8 mg/dL (ref 0.61–1.24)

## 2018-09-27 MED ORDER — IOPAMIDOL (ISOVUE-300) INJECTION 61%
125.0000 mL | Freq: Once | INTRAVENOUS | Status: AC | PRN
  Administered 2018-09-27: 125 mL via INTRAVENOUS

## 2018-09-27 MED ORDER — TAMSULOSIN HCL 0.4 MG PO CAPS: 0.4000 mg | ORAL_CAPSULE | Freq: Every day | ORAL | 3 refills | Status: DC

## 2018-09-27 NOTE — Progress Notes (Signed)
Patient presents today with urinary frequency and dysuria. A urine was collect for ua, ucx. Patient was suppose to have a CT hematuria work up today and was told by Imaging to come to our office for the groin pain. Patient has 3 hernias and states he goes to the restroom every 10-15 minutes. This has been a problem for 2 years. A bladder scan was performed and the residual was 102. He took Tamsulosin in September and stopped the medication on his own.

## 2018-10-02 LAB — CULTURE, URINE COMPREHENSIVE

## 2018-10-04 ENCOUNTER — Telehealth: Payer: Self-pay

## 2018-10-04 ENCOUNTER — Telehealth: Payer: Self-pay | Admitting: *Deleted

## 2018-10-04 NOTE — Telephone Encounter (Signed)
Interpreter ID# [phone]  Patient notified on vmail through interpreter

## 2018-10-04 NOTE — Telephone Encounter (Signed)
-----   Message from [facility], LPN sent at [id number] 12:53 PM EST ----- I left another message about this with them. I also put in a telephone note about this.  ----- Message ----- From: Dominga Ferry, CMA Sent: 09/20/2018   5:28 PM EST To: Lesly Rubenstein, LPN  Please follow up with [facility] on this patient. I rescheduled his CT but I had to leave a message about f/u appointment with Zara Council which isn't until January. Can you see if patient can be seen sooner? Thanks.

## 2018-10-04 NOTE — Telephone Encounter (Signed)
-----   Message from Nori Riis, PA-C sent at 10/04/2018  6:59 AM EST ----- Please let Mr. Jerilee Hoh know that his urine culture was negative.

## 2018-10-04 NOTE — Telephone Encounter (Signed)
I called Levada Dy at [facility] since I had not heard anything back about patient's appointment.   Per Levada Dy, this is the soonest that patient can been seen with Zara Council, PA.

## 2018-10-05 ENCOUNTER — Ambulatory Visit: Payer: Self-pay | Attending: Family Medicine | Admitting: Family Medicine

## 2018-10-05 VITALS — BP 132/85 | HR 64 | Temp 98.1°F | Resp 18 | Ht 62.0 in | Wt 180.0 lb

## 2018-10-05 DIAGNOSIS — F329 Major depressive disorder, single episode, unspecified: Secondary | ICD-10-CM | POA: Insufficient documentation

## 2018-10-05 DIAGNOSIS — Z8249 Family history of ischemic heart disease and other diseases of the circulatory system: Secondary | ICD-10-CM | POA: Insufficient documentation

## 2018-10-05 DIAGNOSIS — Z87438 Personal history of other diseases of male genital organs: Secondary | ICD-10-CM

## 2018-10-05 DIAGNOSIS — Z9889 Other specified postprocedural states: Secondary | ICD-10-CM | POA: Insufficient documentation

## 2018-10-05 DIAGNOSIS — I10 Essential (primary) hypertension: Secondary | ICD-10-CM | POA: Insufficient documentation

## 2018-10-05 DIAGNOSIS — Z8639 Personal history of other endocrine, nutritional and metabolic disease: Secondary | ICD-10-CM

## 2018-10-05 DIAGNOSIS — R319 Hematuria, unspecified: Secondary | ICD-10-CM

## 2018-10-05 DIAGNOSIS — R350 Frequency of micturition: Secondary | ICD-10-CM

## 2018-10-05 DIAGNOSIS — E079 Disorder of thyroid, unspecified: Secondary | ICD-10-CM | POA: Insufficient documentation

## 2018-10-05 DIAGNOSIS — Z833 Family history of diabetes mellitus: Secondary | ICD-10-CM | POA: Insufficient documentation

## 2018-10-05 DIAGNOSIS — N419 Inflammatory disease of prostate, unspecified: Secondary | ICD-10-CM | POA: Insufficient documentation

## 2018-10-05 DIAGNOSIS — E785 Hyperlipidemia, unspecified: Secondary | ICD-10-CM | POA: Insufficient documentation

## 2018-10-05 DIAGNOSIS — Z87891 Personal history of nicotine dependence: Secondary | ICD-10-CM | POA: Insufficient documentation

## 2018-10-05 DIAGNOSIS — F419 Anxiety disorder, unspecified: Secondary | ICD-10-CM | POA: Insufficient documentation

## 2018-10-05 DIAGNOSIS — R300 Dysuria: Secondary | ICD-10-CM

## 2018-10-05 LAB — POCT URINALYSIS DIP (CLINITEK)
Bilirubin, UA: NEGATIVE
Glucose, UA: NEGATIVE mg/dL
Ketones, POC UA: NEGATIVE mg/dL
Leukocytes, UA: NEGATIVE
Nitrite, UA: NEGATIVE
POC PROTEIN,UA: NEGATIVE
Spec Grav, UA: 1.01
Urobilinogen, UA: 0.2 U/dL
pH, UA: 7

## 2018-10-05 LAB — POCT GLYCOSYLATED HEMOGLOBIN (HGB A1C): Hemoglobin A1C: 5.5 % (ref 4.0–5.6)

## 2018-10-05 MED ORDER — CIPROFLOXACIN HCL 500 MG PO TABS: 500.0000 mg | ORAL_TABLET | Freq: Two times a day (BID) | ORAL | 0 refills | Status: DC

## 2018-10-05 NOTE — Progress Notes (Signed)
Subjective:    Patient ID: [name], male    DOB: [birth year], 62 y.o.   MRN: [id number]   Due to language barrier, [employer] video interpretation service used at today's visit  HPI       62 year old male who presents secondary to complaint of approximately 1 week of burning with urination as well as increased urinary frequency.  Patient denies increased back pain.  No fever or chills.  No abdominal pain or nausea.  Patient reports that he did see urology.  Patient states that he feels as if the tip of his penis is irritated and burns when he urinates.  Patient has had no penile discharge.  Patient denies any sores or skin breakdown on the penis.  Patient has seen no visible blood in the urine.  Patient does have some fatigue.  Patient denies any increased thirst, no increased hunger and no blurred vision.  Patient does have family history of mother with diabetes.  Past Medical History:  Diagnosis Date  . 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 Anxiety   . Depression   . ED (erectile dysfunction)   . Hyperlipidemia   . Hypertension   . Thyroid disease   . Urinary incontinence    Past Surgical History:  Procedure Laterality Date  . COLONOSCOPY WITH PROPOFOL N/A 09/13/2018   Procedure: COLONOSCOPY WITH PROPOFOL;  Surgeon: Lin Landsman, MD;  Location: [facility]COPY;  Service: Gastroenterology;  Laterality: N/A;  . ESOPHAGOGASTRODUODENOSCOPY (EGD) WITH PROPOFOL N/A 09/13/2018   Procedure: ESOPHAGOGASTRODUODENOSCOPY (EGD) WITH PROPOFOL;  Surgeon: Lin Landsman, MD;  Location: [facility] ENDOSCOPY;  Service: Gastroenterology;  Laterality: N/A;  . HERNIA REPAIR Right 1990   right inguinal hernia, done in [place]  . I&D EXTREMITY Left 10/26/2013   Procedure: IRRIGATION AND DEBRIDEMENT EXTREMITY AND REPAIR AS NECESSARY;  Surgeon: Roseanne Kaufman, MD;  Location: [place];  Service: Orthopedics;  Laterality: Left;  . INGUINAL HERNIA REPAIR  10/23/2012   Procedure: LAPAROSCOPIC INGUINAL  HERNIA;  Surgeon: Ralene Ok, MD;  Location: [place];  Service: General;  Laterality: Left;  . INSERTION OF MESH  10/23/2012   Procedure: INSERTION OF MESH;  Surgeon: Ralene Ok, MD;  Location: [place];  Service: General;  Laterality: Left;  . NERVE, TENDON AND ARTERY REPAIR Left 10/29/2013   Procedure: IRRIGATION AND DEBRIDMENT LEFT HAND AND BURYING RADIAL DIGITAL NERVE TO LEFT THUMB;  Surgeon: Roseanne Kaufman, MD;  Location: [place];  Service: Orthopedics;  Laterality: Left;   Social History   Tobacco Use  . Smoking status: Former Smoker    Packs/day: 2.00    Years: 16.00    Pack years: 32.00    Last attempt to quit: 10/10/1992    Years since quitting: 26.0  . Smokeless tobacco: Never Used  Substance Use Topics  . Alcohol use: Never    Frequency: Never  . Drug use: No   Family History  Problem Relation Age of Onset  . Heart disease Unknown        No family history   . Diabetes Mother   No Known Allergies    Review of Systems  Constitutional: Positive for fatigue. Negative for chills and fever.  Eyes: Negative for photophobia and visual disturbance.  Respiratory: Negative for cough and shortness of breath.   Cardiovascular: Negative for chest pain, palpitations and leg swelling.  Gastrointestinal: Negative for abdominal pain, constipation, diarrhea and nausea.  Endocrine: Negative for polydipsia and polyphagia.  Genitourinary: Positive for dysuria and frequency.  Musculoskeletal: Negative  for back pain and gait problem.  Neurological: Negative for dizziness and headaches.       Objective:   Physical Exam BP 132/85 (BP Location: Left Arm, Patient Position: Sitting, Cuff Size: Normal)   Pulse 64   Temp 98.1 F (36.7 C) (Oral)   Resp 18   Ht 5\' 2"  (1.575 m)   Wt 180 lb (81.6 kg)   SpO2 100%   BMI 32.92 kg/m Nurse's notes and vital signs reviewed General-well-nourished, well-developed older male in no acute distress Neck-supple, no lymphadenopathy, no  thyromegaly Lungs-clear to auscultation bilaterally Cardiovascular-regular rate and rhythm Abdomen-mild abdominal distention, patient does have some mild suprapubic discomfort to palpation Back-no CVA tenderness Extremities-no edema        Assessment & Plan:  1. Urinary frequency Patient with complaint of recent onset of urinary frequency and dysuria/burning with urination.  Patient denies any exposure to sexually transmitted diseases and no penile discharge.  Patient will have urinalysis to look for any signs of infection but patient also with history of prostatitis.  He has been previously referred to urology.  Patient will also have BMP and hemoglobin A1c in follow-up of his urinary frequency and due to his history of prostatitis, patient is being placed on Cipro for the next 2 weeks.- POCT URINALYSIS DIP (CLINITEK) - Basic Metabolic Panel - HgB Y6V - ciprofloxacin (CIPRO) 500 MG tablet; Take 1 tablet (500 mg total) by mouth 2 (two) times daily.  Dispense: 20 tablet; Refill: 0  2. Hematuria, unspecified type Patient has had hematuria on recent urinalysis as well as on today's urinalysis.  Patient has previously been referred to urology.  Patient will have BMP.  Patient will be placed on Cipro in case of urethritis or prostatitis and patient should follow-up with urology.  Urine will also be sent for culture.  Patient did have CT scan done 09/27/2018 which was ordered by urology in follow-up of hematuria - Basic Metabolic Panel - ciprofloxacin (CIPRO) 500 MG tablet; Take 1 tablet (500 mg total) by mouth 2 (two) times daily.  Dispense: 20 tablet; Refill: 0  3. Dysuria Patient with complaint of burning with urination.  Patient will have urinalysis to look for possible urinary tract infection.  Patient denies penile discharge suggestive of urethritis due to STD.  Patient does have history as well of prostatitis.  Patient has been placed on Cipro 500 mg twice daily for 2 weeks and patient is to  follow-up with urology - ciprofloxacin (CIPRO) 500 MG tablet; Take 1 tablet (500 mg total) by mouth 2 (two) times daily.  Dispense: 20 tablet; Refill: 0  4. History of thyroid disease On review of chart, there is mention of a history of thyroid disease.  Patient cannot provide any details.  Patient will have TSH done at today's visit to look for under or overactive thyroid - TSH - HgB A1c  5. History of prostatitis Patient with complaint of urinary frequency and dysuria.  Patient with history of prostatitis.  Today's urinalysis was negative for nitrites or leukocytes however patient did have hematuria and urine is being sent for culture.  Patient is being placed on Cipro 500 mg twice daily x10 days and patient is to follow-up with urology - ciprofloxacin (CIPRO) 500 MG tablet; Take 1 tablet (500 mg total) by mouth 2 (two) times daily.  Dispense: 20 tablet; Refill: 0  An After Visit Summary was printed and given to the patient.  Return in about 2 weeks (around 10/19/2018).

## 2018-10-06 LAB — TSH: TSH: 1.86 u[IU]/mL (ref 0.450–4.500)

## 2018-10-06 LAB — BASIC METABOLIC PANEL WITH GFR
BUN/Creatinine Ratio: 14 (ref 10–24)
BUN: 11 mg/dL (ref 8–27)
CO2: 23 mmol/L (ref 20–29)
Calcium: 9.2 mg/dL (ref 8.6–10.2)
Chloride: 103 mmol/L (ref 96–106)
Creatinine, Ser: 0.78 mg/dL (ref 0.76–1.27)
GFR calc Af Amer: 112 mL/min/1.73
GFR calc non Af Amer: 97 mL/min/1.73
Glucose: 75 mg/dL (ref 65–99)
Potassium: 4 mmol/L (ref 3.5–5.2)
Sodium: 141 mmol/L (ref 134–144)

## 2018-10-09 ENCOUNTER — Telehealth: Payer: Self-pay | Admitting: *Deleted

## 2018-10-09 NOTE — Telephone Encounter (Signed)
-----   Message from Antony Blackbird, MD sent at 10/07/2018 12:10 AM EST ----- Please notify patient of normal BMP and TSH

## 2018-10-09 NOTE — Telephone Encounter (Signed)
Medical Assistant used [employer] to contact patient.  Interpreter Name: Marcene Brawn Interpreter #: [phone] Patient is aware of TSH/BMP being normal.

## 2018-10-10 ENCOUNTER — Encounter: Payer: Self-pay | Admitting: Family Medicine

## 2018-10-15 ENCOUNTER — Ambulatory Visit: Payer: Self-pay | Attending: Family Medicine | Admitting: Family Medicine

## 2018-10-15 VITALS — BP 115/71 | HR 83 | Temp 98.6°F | Resp 18 | Ht 63.0 in | Wt 198.0 lb

## 2018-10-15 DIAGNOSIS — R319 Hematuria, unspecified: Secondary | ICD-10-CM

## 2018-10-15 DIAGNOSIS — R350 Frequency of micturition: Secondary | ICD-10-CM

## 2018-10-15 LAB — POCT URINALYSIS DIP (CLINITEK)
Bilirubin, UA: NEGATIVE
Glucose, UA: NEGATIVE mg/dL
Ketones, POC UA: NEGATIVE mg/dL
Leukocytes, UA: NEGATIVE
Nitrite, UA: NEGATIVE
POC PROTEIN,UA: NEGATIVE
Spec Grav, UA: 1.015
Urobilinogen, UA: 0.2 U/dL
pH, UA: 5.5

## 2018-10-15 MED ORDER — SULFAMETHOXAZOLE-TRIMETHOPRIM 800-160 MG PO TABS: 1.0000 | ORAL_TABLET | Freq: Two times a day (BID) | ORAL | 0 refills | Status: DC

## 2018-10-15 NOTE — Progress Notes (Signed)
Subjective:    Patient ID: [name], male    DOB: 01/02/1956, 63 y.o.   MRN: [id number]  HPI       63 yo male seen secondary to the complaint of continued urinary frequency.  Patient states that he is supposed to have a surgical procedure next week for repair of hernias and then 2 days later he has the appointment with urology.  Patient reports some improvement in urinary frequency while he was antibiotic therapy.  Patient however does not believe that the recent antibiotic worked as well as the initial antibiotic.  Patient denies current abdominal pain, no fever or chills, no nausea.  Patient does have some mild back pain mostly in the lower back.  Patient denies dysuria/burning with urination, no penile discharge.  Past Medical History:  Diagnosis Date  . 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 Anxiety   . Depression   . ED (erectile dysfunction)   . Hyperlipidemia   . Hypertension    denies  . Thyroid disease   . Umbilical hernia without obstruction or gangrene 08/17/2018  . Unilateral recurrent inguinal hernia without obstruction or gangrene 08/17/2018  . Urinary incontinence   . Past Surgical History:  Procedure Laterality Date  . COLONOSCOPY WITH PROPOFOL N/A 09/13/2018   Procedure: COLONOSCOPY WITH PROPOFOL;  Surgeon: Lin Landsman, MD;  Location[facility] ENDOSCOPY;  Service: Gastroenterology;  Laterality: N/A;  . ESOPHAGOGASTRODUODENOSCOPY (EGD) WITH PROPOFOL N/A 09/13/2018   Procedure: ESOPHAGOGASTRODUODENOSCOPY (EGD) WITH PROPOFOL;  Surgeon: Lin Landsman, MD;  Location: [facility] ENDOSCOPY;  Service: Gastroenterology;  Laterality: N/A;  . HERNIA REPAIR Right 1990   right inguinal hernia, done in [place]  . I&D EXTREMITY Left 10/26/2013   Procedure: IRRIGATION AND DEBRIDEMENT EXTREMITY AND REPAIR AS NECESSARY;  Surgeon: Roseanne Kaufman, MD;  Location: [place];  Service: Orthopedics;  Laterality: Left;  . INGUINAL HERNIA REPAIR  10/23/2012   Procedure: LAPAROSCOPIC INGUINAL  HERNIA;  Surgeon: Ralene Ok, MD;  Location: [place];  Service: General;  Laterality: Left;  . INGUINAL HERNIA REPAIR Right 11/09/2018   Procedure: LAPAROSCOPIC INGUINAL HERNIA REPAIR WITH MESH;  Surgeon: Vickie Epley, MD;  Location: [facility];  Service: General;  Laterality: Right;  . INSERTION OF MESH  10/23/2012   Procedure: INSERTION OF MESH;  Surgeon: Ralene Ok, MD;  Location: [place];  Service: General;  Laterality: Left;  . NERVE, TENDON AND ARTERY REPAIR Left 10/29/2013   Procedure: IRRIGATION AND DEBRIDMENT LEFT HAND AND BURYING RADIAL DIGITAL NERVE TO LEFT THUMB;  Surgeon: Roseanne Kaufman, MD;  Location: [place];  Service: Orthopedics;  Laterality: Left;  . UMBILICAL HERNIA REPAIR N/A 11/09/2018   Procedure: POSSIBLE OPEN REPAIR UMBILICAL HERNIA INTERPRETER APPOINTMENT;  Surgeon: Vickie Epley, MD;  Location: [facility];  Service: General;  Laterality: N/A;   Family History  Problem Relation Age of Onset  . Heart disease Other        No family history   . Diabetes Mother    Social History   Tobacco Use  . Smoking status: Former Smoker    Packs/day: 2.00    Years: 16.00    Pack years: 32.00    Last attempt to quit: 10/10/1992    Years since quitting: 26.1  . Smokeless tobacco: Never Used  Substance Use Topics  . Alcohol use: Never    Frequency: Never  . Drug use: No  No Known Allergies    Review of Systems  Constitutional: Positive for fatigue. Negative for chills and  fever.  Respiratory: Negative for cough and shortness of breath.   Cardiovascular: Negative for chest pain, palpitations and leg swelling.  Gastrointestinal: Negative for abdominal pain, constipation, diarrhea and nausea.  Endocrine: Negative for polydipsia and polyphagia.  Genitourinary: Positive for frequency. Negative for dysuria.  Musculoskeletal: Positive for back pain (mild).  Neurological: Negative for dizziness and headaches.  Hematological: Negative for adenopathy. Does not bruise/bleed  easily.       Objective:   Physical Exam BP 115/71 (BP Location: Left Arm, Patient Position: Sitting, Cuff Size: Large)   Pulse 83   Temp 98.6 F (37 C) (Oral)   Resp 18   Ht 5\' 3"  (1.6 m)   Wt 198 lb (89.8 kg)   SpO2 97%   BMI 35.07 kg/m  Nurse's notes and vital signs reviewed  General- well-nourished, well-developed older male in no acute distress Lungs-clear auscultation bilaterally Cardiovascular-regular rate and rhythm Abdomen-soft, nontender Back-no CVA tenderness, mild lumbosacral paraspinous spasm Psych-normal mood and judgment        Assessment & Plan:  1. Urinary frequency Patient with continued complaints of urinary frequency.  Patient will have urinalysis.  Patient with hematuria on urinalysis therefore urine will be sent for culture and patient will be advised of antibiotic therapy based on the culture results.  Patient is to keep his upcoming appointment with urology for further evaluation and treatment.  Patient has had negative blood work for diabetes as a cause of his urinary frequency.  Patient however may have issues with prostatitis but has been on several courses of antibiotics that would treat this condition or other urinary dysfunction. - POCT URINALYSIS DIP (CLINITEK) - Urine Culture  2. Hematuria, unspecified type Patient with hematuria urinalysis.  Patient's urine will be sent for culture and patient will be notified if further treatment is needed based on these results.  Patient is encouraged to keep his upcoming urology appointment - Urine Culture  An After Visit Summary was printed and given to the patient.  Return if symptoms worsen or fail to improve, for keep appointment with Urology.

## 2018-10-17 LAB — URINE CULTURE: Organism ID, Bacteria: NO GROWTH

## 2018-10-19 ENCOUNTER — Telehealth: Payer: Self-pay | Admitting: Surgery

## 2018-10-19 ENCOUNTER — Telehealth: Payer: Self-pay | Admitting: Urology

## 2018-10-19 NOTE — Telephone Encounter (Signed)
error 

## 2018-10-19 NOTE — Telephone Encounter (Signed)
Pt's friend called for him (he needs an interpreter) He has blood in his urine

## 2018-10-19 NOTE — Telephone Encounter (Signed)
Spoke to patient and he will drink more water to void more often. He states the blood was only a little bit. He is not having any problems urinating or discomfort.

## 2018-10-23 ENCOUNTER — Ambulatory Visit: Payer: Self-pay | Admitting: Gastroenterology

## 2018-10-23 ENCOUNTER — Encounter: Payer: Self-pay | Admitting: Gastroenterology

## 2018-10-23 VITALS — BP 129/82 | HR 87 | Resp 17 | Ht 63.0 in | Wt 195.8 lb

## 2018-10-23 DIAGNOSIS — D126 Benign neoplasm of colon, unspecified: Secondary | ICD-10-CM

## 2018-10-23 DIAGNOSIS — R350 Frequency of micturition: Secondary | ICD-10-CM

## 2018-10-23 DIAGNOSIS — K4090 Unilateral inguinal hernia, without obstruction or gangrene, not specified as recurrent: Secondary | ICD-10-CM

## 2018-10-23 NOTE — Progress Notes (Signed)
George Darby, MD [address]  [place]  [place], [place] [address]  Main: [phone]  Fax: [phone]    Gastroenterology Consultation  Referring Provider:     Antony Blackbird, MD Primary Care Physician:  Antony Blackbird, MD Primary Gastroenterologist:  Dr. Cephas Darby Reason for Consultation:   Diarrhea and abdominal bloating        HPI:   [name] is a 63 y.o. male referred by Dr. Antony Blackbird, MD  for consultation & management. He was suffering from constipation until 2 years ago. Sine then, his BMs are more frequent, upto 4-5 times/day, nonbloody, variable consistency. A/w abdominal bloating, no weight loss, LOA. He also has problem with frequent urination, seen by urology on 08/09/18. Undergoing work up. He is seen by Dr Rosana Hoes, surgeon for repair of umbilical hernia and right inguinal hernia. Referred here to discuss about colonoscopy as he is overdue for cancer screening.   Follow-up visit 10/23/2018 Patient underwent colonoscopy which revealed subcentimeter adenomatous polyp.  There was no evidence of microscopic colitis or IBD.  He does not have celiac disease or H. pylori infection.  He reports that, since colonoscopy his bowel movements are regular, denies abdominal bloating.  He has ongoing urinary symptoms, particularly severe discomfort during urination.  He saw surgeon, Dr. Rosana Hoes after the colonoscopy and he was recommended to see urologist for ongoing urinary symptoms and evaluate for prostatitis before undergoing hernia repair.  Patient has appointment with urology on 10/25/2018.  Patient underwent seed hematuria work-up in 09/2018 which revealed normal prostate gland.  NSAIDs: none  Antiplts/Anticoagulants/Anti thrombotics: none  GI Procedures:  EGD and colonoscopy 09/13/2018 - Normal duodenal bulb and second portion of the duodenum. Biopsied. - Erythematous mucosa in the antrum. Biopsied. - A single lesion diagnostic of aberrant pancreas was found in  the stomach. - Erythematous mucosa in the gastric body. Biopsied. - Esophagogastric landmarks identified. - Normal esophagus.  - Two 6 mm polyps in the ascending colon and in the cecum, removed with a cold snare. Resected and retrieved. - Normal mucosa in the entire examined colon. Biopsied. - The distal rectum and anal verge are normal on retroflexion view.  DIAGNOSIS:  A. DUODENUM; COLD BIOPSY:  - ENTERIC MUCOSA WITH PRESERVED VILLOUS ARCHITECTURE AND NO SIGNIFICANT  HISTOPATHOLOGIC CHANGE.  - NEGATIVE FOR FEATURES OF CELIAC, DYSPLASIA, AND MALIGNANCY.   B. STOMACH, ANTRUM; COLD BIOPSY:  - GASTRIC ANTRAL MUCOSA WITH MILD REACTIVE GASTROPATHY.  - NEGATIVE FOR H. PYLORI, DYSPLASIA, AND MALIGNANCY.   C. STOMACH, BODY; COLD BIOPSY:  - GASTRIC OXYNTIC MUCOSA WITH NO SIGNIFICANT HISTOPATHOLOGIC CHANGE.  - NEGATIVE FOR H. PYLORI, DYSPLASIA, AND MALIGNANCY.   D. COLON POLYP, CECUM; COLD SNARE:  - BENIGN COLONIC TISSUE SHOWING SUPERFICIAL HYPERPLASTIC CHANGES.  - NEGATIVE FOR ADENOMATOUS CHANGE AND MALIGNANCY.   E. COLON POLYP, ASCENDING; COLD SNARE:  - TUBULAR ADENOMA.  - NEGATIVE FOR HIGH-GRADE DYSPLASIA AND MALIGNANCY.   F. COLON, RANDOM; COLD BIOPSY:  - BENIGN COLONIC TISSUE WITH NO SIGNIFICANT HISTOPATHOLOGIC CHANGE.  - NEGATIVE FOR MICROSCOPIC COLITIS, DYSPLASIA, AND MALIGNANCY.   He denies family history of GI malignancy He had a hernia repair in 1990s   Past Medical History:  Diagnosis Date  . 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 Anxiety   . Depression   . ED (erectile dysfunction)   . Hyperlipidemia   . Hypertension   . Thyroid disease   . Urinary incontinence     Past Surgical History:  Procedure Laterality Date  .  COLONOSCOPY WITH PROPOFOL N/A 09/13/2018   Procedure: COLONOSCOPY WITH PROPOFOL;  Surgeon: Lin Landsman, MD;  Location: [facility] ENDOSCOPY;  Service: Gastroenterology;  Laterality: N/A;  . ESOPHAGOGASTRODUODENOSCOPY (EGD) WITH PROPOFOL N/A  09/13/2018   Procedure: ESOPHAGOGASTRODUODENOSCOPY (EGD) WITH PROPOFOL;  Surgeon: Lin Landsman, MD;  Location: [facility] ENDOSCOPY;  Service: Gastroenterology;  Laterality: N/A;  . HERNIA REPAIR Right 1990   right inguinal hernia, done in [place]  . I&D EXTREMITY Left 10/26/2013   Procedure: IRRIGATION AND DEBRIDEMENT EXTREMITY AND REPAIR AS NECESSARY;  Surgeon: Roseanne Kaufman, MD;  Location: [place];  Service: Orthopedics;  Laterality: Left;  . INGUINAL HERNIA REPAIR  10/23/2012   Procedure: LAPAROSCOPIC INGUINAL HERNIA;  Surgeon: Ralene Ok, MD;  Location: [place];  Service: General;  Laterality: Left;  . INSERTION OF MESH  10/23/2012   Procedure: INSERTION OF MESH;  Surgeon: Ralene Ok, MD;  Location: [place];  Service: General;  Laterality: Left;  . NERVE, TENDON AND ARTERY REPAIR Left 10/29/2013   Procedure: IRRIGATION AND DEBRIDMENT LEFT HAND AND BURYING RADIAL DIGITAL NERVE TO LEFT THUMB;  Surgeon: Roseanne Kaufman, MD;  Location: [place];  Service: Orthopedics;  Laterality: Left;    Current Outpatient Medications:  .  ciprofloxacin (CIPRO) 500 MG tablet, Take 1 tablet (500 mg total) by mouth 2 (two) times daily., Disp: 20 tablet, Rfl: 0 .  mirabegron ER (MYRBETRIQ) 25 MG TB24 tablet, Take 1 tablet (25 mg total) by mouth daily., Disp: 1 tablet, Rfl: 0 .  nystatin-triamcinolone ointment (MYCOLOG), Apply 1 application topically 2 (two) times daily., Disp: 30 g, Rfl: 0 .  sulfamethoxazole-trimethoprim (BACTRIM DS,SEPTRA DS) 800-160 MG tablet, Take 1 tablet by mouth 2 (two) times daily for 10 days., Disp: 20 tablet, Rfl: 0 .  tamsulosin (FLOMAX) 0.4 MG CAPS capsule, Take 1 capsule (0.4 mg total) by mouth daily., Disp: 30 capsule, Rfl: 3   Family History  Problem Relation Age of Onset  . Heart disease Unknown        No family history   . Diabetes Mother      Social History   Tobacco Use  . Smoking status: Former Smoker    Packs/day: 2.00    Years: 16.00    Pack years: 32.00    Last  attempt to quit: 10/10/1992    Years since quitting: 26.0  . Smokeless tobacco: Never Used  Substance Use Topics  . Alcohol use: Never    Frequency: Never  . Drug use: No    Allergies as of 10/23/2018  . (No Known Allergies)    Review of Systems:    All systems reviewed and negative except where noted in HPI.   Physical Exam:  BP 129/82 (BP Location: Left Arm, Patient Position: Sitting, Cuff Size: Large)   Pulse 87   Resp 17   Ht 5\' 3"  (1.6 m)   Wt 195 lb 12.8 oz (88.8 kg)   BMI 34.68 kg/m  No LMP for male patient.  General:   Alert,  Well-developed, well-nourished, pleasant and cooperative in NAD Head:  Normocephalic and atraumatic. Eyes:  Sclera clear, no icterus.   Conjunctiva pink. Ears:  Normal auditory acuity. Nose:  No deformity, discharge, or lesions. Mouth:  No deformity or lesions,oropharynx pink & moist. Neck:  Supple; no masses or thyromegaly. Lungs:  Respirations even and unlabored.  Clear throughout to auscultation.   No wheezes, crackles, or rhonchi. No acute distress. Heart:  Regular rate and rhythm; no murmurs, clicks, rubs, or gallops. Abdomen:  Normal  bowel sounds. Soft, non-tender and nondistended, tympanic without masses, hepatosplenomegaly noted.  No guarding or rebound tenderness.   Rectal: Not performed Msk:  Symmetrical without gross deformities. Good, equal movement & strength bilaterally. Pulses:  Normal pulses noted. Extremities:  No clubbing or edema.  No cyanosis. Neurologic:  Alert and oriented x3;  grossly normal neurologically. Skin:  Intact without significant lesions or rashes. No jaundice. Lymph Nodes:  No significant cervical adenopathy. Psych:  Alert and cooperative. Normal mood and affect.  Imaging Studies: None  Assessment and Plan:   Crespin Atkins is a 63 y.o. Spanish-speaking male with history of hypertension, hyperlipidemia seen in consultation to discuss about colonoscopy for colon cancer screening as well as 2 years  history of increased bowel frequency with abdominal bloating.  His diarrhea and bloating has currently resolved.  EGD and colonoscopy unremarkable.  No further work-up is recommended at this time from GI standpoint  Right inguinal hernia Follow-up with general surgery, Dr. Rosana Hoes  Tubular adenoma: Recommend surveillance colonoscopy in 09/2023  Discomfort during urination Follow-up with urology  Follow up as needed   George Darby, MD

## 2018-10-25 ENCOUNTER — Ambulatory Visit (INDEPENDENT_AMBULATORY_CARE_PROVIDER_SITE_OTHER): Payer: Self-pay | Admitting: Urology

## 2018-10-25 ENCOUNTER — Encounter: Payer: Self-pay | Admitting: Urology

## 2018-10-25 VITALS — BP 130/85 | HR 76 | Ht 63.0 in | Wt 196.0 lb

## 2018-10-25 DIAGNOSIS — N529 Male erectile dysfunction, unspecified: Secondary | ICD-10-CM

## 2018-10-25 DIAGNOSIS — N481 Balanitis: Secondary | ICD-10-CM

## 2018-10-25 DIAGNOSIS — N486 Induration penis plastica: Secondary | ICD-10-CM

## 2018-10-25 DIAGNOSIS — R300 Dysuria: Secondary | ICD-10-CM

## 2018-10-25 DIAGNOSIS — N3281 Overactive bladder: Secondary | ICD-10-CM

## 2018-10-25 LAB — BLADDER SCAN AMB NON-IMAGING

## 2018-10-25 NOTE — Progress Notes (Signed)
10/25/2018  1:37 PM   [name] May 24, 1956 [id number]  Referring provider: Antony Blackbird, MD [place], [place] [address]  Chief Complaint  Patient presents with  . Results    HPI: Cesare Sumlin is a 63 y.o. Hispanic or Latino male that presents today to follow up with his recent CT hematuria workup in 09/2018. He presents today with interpreter, [name].  He was originally referred by Dr. Antony Blackbird for urinary frequency. At his initial visit, he reported that his most bothersome system was frequency and occasional dysuria. He reported lower abdominal pain, urgency, nocturia, intermittency, hesitancy, straining to urinate and a weak urinary stream. He has had these symptoms for two years and seen 2 prior urologists for his symptoms. Denies gross, hematuria, flank pain, fevers, chills, nausea and vomiting. His UA at that time was positive for 11-30 RBCs and moderate bacteria. His PVR was 0 mL  In 2018, he was seen by urology specialty care in [facility] and diagnosed with chronic prostatitis/pelvic pain syndrome. He underwent cystoscopy with hydrodistention on January 15, 2018.  He was then evaluated in 2019 through [facility] in [place].  He was having the same complaints and was tried on Flomax, finasteride and Elmiron without relief.  He underwent MRI and CT urogram's which demonstrated benign cysts.  A TRUS showed a 30 cc prostate.  He underwent cystoscopy and was noted to have erythema at the bladder base, but biopsies and cytology were negative. He also had ED for which he was taking sildenafil.    I last saw him on 08/09/2018 for balanitis, urinary frequency and microscopic hematuria. At that time I gave him a trial of Myrbetriq 25mg  and a prescription for Mycolog cream.   Today he reports that after finishing a trial of Myrbetriq he noted no change in his symptoms. He reports that he is experiencing nocturia x 8 (last night x 6 for the  first time) and daytime frequency x 8-10+. He reports penile pain with urination and flank pain when he is unable to void. While sitting for extended periods of time he reports testicular pain and urgency. He notes that when he is sexually aroused he immediately experiences urgency as well. Reports foamy urination and a recent need to flatulate in order to urinate. He finds his symptoms extremely inhibitory to his normal life functions.  He drinks 6-8 8-ounce bottles of water daily. Does not drink coffee and occasionally drinks tea. He drinks 1/2 bottle cranberry juice and 1/2 bottle of carrot juice daily as well as 1 glass of milk. Reports burning dysuria when he eats spicy foods. He doesn't drink alcohol. He quit smoking in 1998.    He has never seen a nephrologist. His PVR today is 107 mL.   While discussing options for treatment, he expressed that he has an aversion to catheterization due to previous painful experience with catheterization 8 months ago. Although, he expressed he is willing to learn if it will be painless and helpful.  Balanitis Patient reported there was a white discoloration on his penis for about 1 year on his visit in 07/2018. He was prescribed Mycolog cream at that time. Patient reports today that his white discoloration on penis has become hyperpigmented. On exam today, balanitis has resolved.  Possible Peyronie's Patient reports an episode of painful intercourse 6-8 months ago. He reports curvature to the right with erection and occasionally right sided swelling. He admits these symptoms have persisted for the past year.  He is still experiencing penile pain at this time.  Erectile Dysfunction Patient has used sildenafil in the past to treat these symptoms.  He reports that he has had ED for the past 2 years. He report decreased sexual activity/desire and difficulty getting and maintaining an erection. He reports urgency on sexual arousal. He experiences rare  spontaneous nighttime erections.  PMH: Past Medical History:  Diagnosis Date  . 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 Anxiety   . Depression   . ED (erectile dysfunction)   . Hyperlipidemia   . Hypertension   . Thyroid disease   . Urinary incontinence    Surgical History: Past Surgical History:  Procedure Laterality Date  . COLONOSCOPY WITH PROPOFOL N/A 09/13/2018   Procedure: COLONOSCOPY WITH PROPOFOL;  Surgeon: Lin Landsman, MD;  [facility] END[facility]COPY;  Service: Gastroenterology;  Laterality: N/A;  . ESOPHAGOGASTRODUODENOSCOPY (EGD) WITH PROPOFOL N/A 09/13/2018   Procedure: ESOPHAGOGASTRODUODENOSCOPY (EGD) WITH PROPOFOL;  Surgeon: Lin Landsman, MD;  Location: [facility] ENDOSCOPY;  Service: Gastroenterology;  Laterality: N/A;  . HERNIA REPAIR Right 1990   right inguinal hernia, done in [place]  . I&D EXTREMITY Left 10/26/2013   Procedure: IRRIGATION AND DEBRIDEMENT EXTREMITY AND REPAIR AS NECESSARY;  Surgeon: Roseanne Kaufman, MD;  Location: [place];  Service: Orthopedics;  Laterality: Left;  . INGUINAL HERNIA REPAIR  10/23/2012   Procedure: LAPAROSCOPIC INGUINAL HERNIA;  Surgeon: Ralene Ok, MD;  Location: [place];  Service: General;  Laterality: Left;  . INSERTION OF MESH  10/23/2012   Procedure: INSERTION OF MESH;  Surgeon: Ralene Ok, MD;  Location: [place];  Service: General;  Laterality: Left;  . NERVE, TENDON AND ARTERY REPAIR Left 10/29/2013   Procedure: IRRIGATION AND DEBRIDMENT LEFT HAND AND BURYING RADIAL DIGITAL NERVE TO LEFT THUMB;  Surgeon: Roseanne Kaufman, MD;  Location: [place];  Service: Orthopedics;  Laterality: Left;    Home Medications:  Allergies as of 10/25/2018   No Known Allergies     Medication List       Accurate as of October 25, 2018  1:37 PM. Always use your most recent med list.        ciprofloxacin 500 MG tablet Commonly known as:  CIPRO Take 1 tablet (500 mg total) by mouth 2 (two) times daily.       Allergies: No Known  Allergies  Family History: Family History  Problem Relation Age of Onset  . Heart disease Unknown        No family history   . Diabetes Mother     Social History:  reports that he quit smoking about 26 years ago. He has a 32.00 pack-year smoking history. He has never used smokeless tobacco. He reports that he does not drink alcohol or use drugs.  ROS: UROLOGY Frequent Urination?: Yes Hard to postpone urination?: Yes Burning/pain with urination?: Yes Get up at night to urinate?: Yes Leakage of urine?: No Urine stream starts and stops?: Yes Trouble starting stream?: Yes Do you have to strain to urinate?: Yes Blood in urine?: Yes Urinary tract infection?: Yes Sexually transmitted disease?: Yes Injury to kidneys or bladder?: No Painful intercourse?: No Weak stream?: Yes Erection problems?: Yes Penile pain?: Yes  Gastrointestinal Nausea?: No Vomiting?: No Indigestion/heartburn?: No Diarrhea?: No Constipation?: No  Constitutional Fever: No Night sweats?: No Weight loss?: No Fatigue?: No  Skin Skin rash/lesions?: No Itching?: Yes  Eyes Blurred vision?: No Double vision?: No  Ears/Nose/Throat Sore throat?: No Sinus problems?: No  Hematologic/Lymphatic Swollen glands?: No  Easy bruising?: No  Cardiovascular Leg swelling?: No Chest pain?: No  Respiratory Cough?: No Shortness of breath?: Yes  Endocrine Excessive thirst?: No  Musculoskeletal Back pain?: Yes Joint pain?: No  Neurological Headaches?: Yes Dizziness?: No  Psychologic Depression?: No Anxiety?: Yes  Physical Exam: BP 130/85   Pulse 76   Ht 5\' 3"  (1.6 m)   Wt 196 lb (88.9 kg)   BMI 34.72 kg/m   Constitutional:  Well nourished. Alert and oriented, No acute distress. Respiratory: Normal respiratory effort, no increased work of breathing. GU: No CVA tenderness.  No bladder fullness or masses.  Patient with uncircumcised phallus.  Foreskin easily retracted. Urethral meatus is  patent.  No penile discharge.  Scrotum without lesions, cysts, rashes and/or edema.  Testicles are located scrotally bilaterally. No masses are appreciated in the testicles. Left and right epididymis are normal. Skin: No rashes, bruises or suspicious lesions. Neurologic: Grossly intact, no focal deficits, moving all 4 extremities. Psychiatric: Normal mood and affect.  Laboratory Data: PSA 0.4 in 04/2018  Lab Results  Component Value Date   CREATININE 0.78 10/05/2018   Lab Results  Component Value Date   HGBA1C 5.5 10/05/2018   Lab Results  Component Value Date   TSH 1.860 10/05/2018   Pertinent Imaging: Results for orders placed or performed in visit on 10/25/18  Bladder Scan (Post Void Residual) in office  Result Value Ref Range   Scan Result 126ml    CLINICAL DATA:  Hematuria.  EXAM: CT ABDOMEN AND PELVIS WITHOUT AND WITH CONTRAST  TECHNIQUE: Multidetector CT imaging of the abdomen and pelvis was performed following the standard protocol before and following the bolus administration of intravenous contrast.  CONTRAST:  162mL ISOVUE-300 IOPAMIDOL (ISOVUE-300) INJECTION 61%  COMPARISON:  None.  FINDINGS: Lower chest: The lung bases are clear of acute process. No pleural effusion or pulmonary lesions. The heart is normal in size. No pericardial effusion. The distal esophagus and aorta are unremarkable.  Hepatobiliary: A few small low-attenuation lesions consistent with benign cysts. No worrisome hepatic lesions or intrahepatic biliary dilatation. The gallbladder is normal. No common bile duct dilatation.  Pancreas: No mass, inflammation or ductal dilatation.  Spleen: Normal size.  No focal lesions.  Adrenals/Urinary Tract: The adrenal glands are normal.  No renal, ureteral or bladder calculi. Both kidneys demonstrate normal enhancement/perfusion. No worrisome renal lesions. The delayed images do not demonstrate any significant collecting  system abnormalities. Both ureters are normal. The bladder is normal.  Stomach/Bowel: The stomach, duodenum, small bowel and colon are grossly normal without oral contrast. No inflammatory changes, mass lesions or obstructive findings. The appendix is normal.  Vascular/Lymphatic: The aorta is normal in caliber. No dissection. The branch vessels are patent. The major venous structures are patent. No mesenteric or retroperitoneal mass or adenopathy. Small scattered lymph nodes are noted.  Reproductive: The prostate gland and seminal vesicles are unremarkable.  Other: Evidence of prior surgical repair of a left inguinal hernia. There is a right inguinal hernia containing fat.  Musculoskeletal: No significant bony findings.  IMPRESSION: 1. No CT findings to account for the patient's hematuria. No renal, ureteral or bladder calculi or mass. 2. Benign-appearing hepatic cysts. 3. No acute abdominal/pelvic findings, mass lesions or adenopathy. 4. Remote left inguinal hernia repair and small right inguinal hernia containing fat.   Electronically Signed   By: Marijo Sanes M.D.   On: 09/28/2018 08:49  I have independently reviewed the films and note that the left lower ureter did not opacify,  but he has had several upper tract imaging recently with urine cytology and bladder biopsies.     Assessment & Plan:    1.  Urinary frequency  - Patient reports Myrbetriq failed to alleviate his symptoms  - He is experiencing Nocturia x8 and frequency x 10+  - His CT revealed no apparent cause for patients symptoms.  - We discussed surgical intervention options for his possible OAB including botox. Patient is hesitant to self-catheterize but is willing to try if there is no pain  - Referred to nephrology for follow up of symptoms  2. Balanitis  - Patient reported there white discoloration on his penis x1 yr in 07/2018; Prescribed Mycolog cream  - Today he report the discoloration  has become hyperpigmented. On exam today, balanitis has resolved.  3. ED  - He reports ED x 2 years and painful erections  - He has tried sildenafil in the past  - At this time, due to his overwhelming urinary frequency we have made the cooperative decision to focus on resolving his urinary frequency at this time  4. Peyronie's Disease  - Patient is still experiencing painful erections following possible trauma during last intercourse.   - I instructed the patient to self-perform penile modeling activities at home each day for the 6-week period, this consists of during spontaneous erections, gently attempt to straighten the penis without producing pain and hold the penis in a straightened position for 30 seconds and the flaccid penis should be gently stretched three times daily. Slow, gentle force should be used without producing pain. Once his pain dies down naturally.  - We discussed pharmacological interventions  Return for appointment with Dr. Matilde Sprang for refractory OAB .  Zara Council, PA-C [facility] [address]  [place], [place] [address] [phone]  30-minute discussion with patient regarding abnormal lab results, diagnosis possibilities, treatment options, risks and benefits. The patient had few questions and concerns regarding these options and the long-term effects.  20 minutes of 30 minute visit spent counseling/coordinating care (66%).  I, Temidayo Atanda-Ogunleye , am acting as a scribe for [facility], PA-C  I have reviewed the above documentation for accuracy and completeness, and I agree with the above.    Zara Council, PA-C

## 2018-10-31 ENCOUNTER — Telehealth: Payer: Self-pay | Admitting: *Deleted

## 2018-10-31 NOTE — Telephone Encounter (Signed)
Per Caryl Pina in [place], patient has already been notified of Pre-admit appointment scheduled for 11-06-18 at 1 pm per [facility].

## 2018-10-31 NOTE — Telephone Encounter (Signed)
Medical Assistant used [employer] to contact patient.  Interpreter Name: Daiva Nakayama #: [id number] Patient is aware of no bacteria being noted on urine culture and to keep urology appointment.

## 2018-10-31 NOTE — Telephone Encounter (Signed)
-----   Message from Nori Riis, PA-C sent at 10/29/2018 10:34 AM EST ----- Yes.  He is okay to proceed with a hernia repair.  His culture was negative.  ----- Message ----- From: Dominga Ferry, CMA Sent: 10/29/2018   9:40 AM EST To: Nori Riis, PA-C  You saw patient on 10-25-18. Is he okay to proceed with hernia repair from Urology standpoint? Dr. Rosana Hoes wanted to make sure he does not have an infection. Thanks.

## 2018-10-31 NOTE — Telephone Encounter (Signed)
Patient contacted today via [place] interpreter, [facility] ID # [phone].   The patient was notified that from Urology standpoint, we can proceed with hernia surgery.   Patient's surgery to be scheduled for 11-09-18 at [facility] with Dr. Rosana Hoes. Dr. Dahlia Byes will be assisting with this case.   The patient is aware he will need to Pre-Admit. Patient will check in at the [place], [address] (first floor). Patient aware he will be contacted with date and time  once [facility] has arranged.   The patient is aware to call the office should he have further questions.   *Patient also states that he has received [employer] . Letter dated 07-23-18. Terms 01-12-19. Patient is aware that we will need a copy of the letter and to bring to next office visit.

## 2018-10-31 NOTE — Telephone Encounter (Signed)
-----   Message from Antony Blackbird, MD sent at 10/17/2018  9:04 AM EST ----- Please notify patient that his urine culture did not show the growth of bacteria- please remind him to keep his follow-up appointment with Urology

## 2018-11-06 ENCOUNTER — Other Ambulatory Visit: Payer: Self-pay

## 2018-11-06 ENCOUNTER — Encounter
Admission: RE | Admit: 2018-11-06 | Discharge: 2018-11-06 | Disposition: A | Discharge status: Home / Self Care | Payer: Self-pay | Source: Ambulatory Visit | Attending: Surgery | Admitting: Surgery

## 2018-11-06 DIAGNOSIS — Z01812 Encounter for preprocedural laboratory examination: Secondary | ICD-10-CM | POA: Insufficient documentation

## 2018-11-06 LAB — BASIC METABOLIC PANEL
Anion gap: 7 (ref 5–15)
BUN: 10 mg/dL (ref 8–23)
CO2: 27 mmol/L (ref 22–32)
CREATININE: 0.7 mg/dL (ref 0.61–1.24)
Calcium: 9.1 mg/dL (ref 8.9–10.3)
Chloride: 104 mmol/L (ref 98–111)
GFR calc Af Amer: >60 mL/min (ref >60)
GFR calc non Af Amer: >60 mL/min (ref >60)
Glucose, Bld: 89 mg/dL (ref 70–99)
Potassium: 3.6 mmol/L (ref 3.5–5.1)
Sodium: 138 mmol/L (ref 135–145)

## 2018-11-06 LAB — CBC WITH DIFFERENTIAL/PLATELET
Abs Immature Granulocytes: 0.01 10*3/uL (ref 0.00–0.07)
Basophils Absolute: 0 10*3/uL (ref 0.0–0.1)
Basophils Relative: 1 %
EOS PCT: 7 %
Eosinophils Absolute: 0.4 10*3/uL (ref 0.0–0.5)
HCT: 42.5 % (ref 39.0–52.0)
Hemoglobin: 13.9 g/dL (ref 13.0–17.0)
Immature Granulocytes: 0 %
Lymphocytes Relative: 33 %
Lymphs Abs: 2.1 10*3/uL (ref 0.7–4.0)
MCH: 26.3 pg (ref 26.0–34.0)
MCHC: 32.7 g/dL (ref 30.0–36.0)
MCV: 80.5 fL (ref 80.0–100.0)
Monocytes Absolute: 0.7 10*3/uL (ref 0.1–1.0)
Monocytes Relative: 11 %
Neutro Abs: 3 10*3/uL (ref 1.7–7.7)
Neutrophils Relative %: 48 %
Platelets: 159 10*3/uL (ref 150–400)
RBC: 5.28 MIL/uL (ref 4.22–5.81)
RDW: 16.3 % — ABNORMAL HIGH (ref 11.5–15.5)
WBC: 6.3 10*3/uL (ref 4.0–10.5)
nRBC: 0 % (ref 0.0–0.2)

## 2018-11-06 NOTE — Patient Instructions (Signed)
Your procedure is scheduled on: 11/09/18 Su procedimiento est programado para: Report to Day Surgery.[facility] SECOND FLOOR Presntese a: To find out your arrival time please call [phone] between 1PM - 3PM on 11/08/18. Para saber su hora de llegada por favor llame al [phone] entre la 1PM - 3PM el da:   Remember: Instructions that are not followed completely may result in serious medical risk, up to and including death,  or upon the discretion of your surgeon and anesthesiologist your surgery may need to be rescheduled.  Recuerde: Las instrucciones que no se siguen completamente Heritage manager en un riesgo de salud grave, incluyendo hasta  la [place] o a discrecin de su cirujano y Environmental health practitioner, su ciruga se puede posponer.   __X_ 1.Do not eat food after midnight the night before your procedure. No    gum chewing or hard candies. You may drink clear liquids up to 2 hours     before you are scheduled to arrive for your surgery- DO not drink clear     Liquids within 2 hours of the start of your surgery.     Clear Liquids include:    water, apple juice without pulp, clear carbohydrate drink such as    Clearfast of Gartorade, Black Coffee or Tea (Do not add anything to coffee or tea).      No coma nada despus de la medianoche de la noche anterior a su    procedimiento. No coma chicles ni caramelos duros. Puede tomar    lquidos claros hasta 2 horas antes de su hora programada de llegada al     hospital para su procedimiento. No tome lquidos claros durante el     transcurso de las 2 horas de su llegada programada al hospital para su     procedimiento, ya que esto puede llevar a que su procedimiento se    retrase o tenga que volver a Health and safety inspector.  Los lquidos claros incluyen:          - Agua o jugo de [place] sin pulpa          - Bebidas claras con carbohidratos como ClearFast o Gatorade          - Caf negro o t claro (sin leche, sin cremas, no agregue nada al caf ni  al t)  No tome nada que no est en esta lista.  Los pacientes con diabetes tipo 1 y tipo 2 solo deben Agricultural engineer.  Llame a la clnica de [facility] o a la unidad de Same Day Surgery si  tiene alguna pregunta sobre estas instrucciones.              _X__ 2.Do Not Smoke or use e-cigarettes For 24 Hours Prior to Your Surgery.    Do not use any chewable tobacco products for at least 6   hours prior to surgery.    No fume ni use cigarrillos electrnicos durante las 24 horas previas    a su [place].  No use ningn producto de tabaco masticable durante   al menos 6 horas antes de la [place].     __X_ 3. No alcohol for 24 hours before or after surgery.    No tome alcohol durante las 24 horas antes ni despus de la [place].   ____4. Bring all medications with you on the day of surgery if instructed.    Lleve todos los medicamentos con usted el da de su ciruga si se le    ha indicado as.  __X__ 5. Notify your doctor if there is any change in your medical condition (cold,fever, infections).    Informe a su mdico si hay algn cambio en su condicin mdica  (resfriado, fiebre, infecciones).   Do not wear jewelry, make-up, hairpins, clips or nail polish.  No use joyas, maquillajes, pinzas/ganchos para el cabello ni esmalte de uas.  Do not wear lotions, powders, or perfumes. You may wear deodorant.  No use lociones, polvos o perfumes.  Puede usar desodorante.    Do not shave 48 hours prior to surgery. Men may shave face and neck.  No se afeite 48 horas antes de la [place].  Los hombres pueden [employer] cara  y el cuello.   Do not bring valuables to the hospital.   No lleve objetos [place] is not responsible for any belongings or valuables.  [place] no se hace responsable de ningn tipo de pertenencias u objetos de Geographical information systems officer.               Contacts, dentures or bridgework may not be worn into surgery.  Los lentes de [place], las dentaduras postizas o puentes no  se pueden usar en la [place].   Leave your suitcase in the car. After surgery it may be brought to your room.  Deje su maleta en el auto.  Despus de la ciruga podr traerla a su habitacin.   For patients admitted to the hospital, discharge time is determined by your  treatment team.  Para los pacientes que sean ingresados al hospital, el tiempo en el cual se le  dar de alta es determinado por su equipo de [place].   Patients discharged the day of surgery will not be allowed to drive home. A los pacientes que se les da de alta el mismo da de la ciruga no se les permitir conducir a Holiday representative.   Please read over the following fact sheets that you were given: Por favor [place] informacin que le dieron:   Surgical Site Infection Prevention   ____ Take these medicines the morning of surgery with A SIP OF WATER:          [employer] medicinas la maana de la ciruga con UN SORBO DE AGUA:  1. NONE  2.   3.   4.       5.  6.  ____ Fleet Enema (as directed)          Enema de Fleet (segn lo indicado)    __X__ Use CHG Soap as directed          Utilice el jabn de CHG segn lo indicado  ____ Use inhalers on the day of surgery          Use los inhaladores el da de la ciruga  ____ Stop metformin 2 days prior to surgery          Deje de tomar el metformin 2 das antes de la ciruga    ____ Take 1/2 of usual insulin dose the night before surgery and none on the morning of surgery           Tome la mitad de la dosis habitual de insulina la noche antes de la [place] y no tome nada en la maana de la             ciruga  ____ Stop Coumadin/Plavix/aspirin on           Deje de tomar el Coumadin/Plavix/aspirina  el da:  ____ Stop Anti-inflammatories on           Deje de tomar antiinflamatorios el da:   ____ Stop supplements until after surgery            Deje de tomar suplementos hasta despus de la ciruga  ____ Bring C-Pap to the hospital          [place] al hospital

## 2018-11-08 MED ORDER — CEFAZOLIN SODIUM-DEXTROSE 2-4 GM/100ML-% IV SOLN
2.0000 g | INTRAVENOUS | Status: AC
  Administered 2018-11-09: 2 g via INTRAVENOUS

## 2018-11-09 ENCOUNTER — Other Ambulatory Visit: Payer: Self-pay

## 2018-11-09 ENCOUNTER — Ambulatory Visit
Admission: RE | Admit: 2018-11-09 | Discharge: 2018-11-09 | Disposition: A | Discharge status: Home / Self Care | Payer: Self-pay | Attending: Surgery | Admitting: Surgery

## 2018-11-09 ENCOUNTER — Encounter
Admission: RE | Disposition: A | Discharge status: Home / Self Care | Payer: Self-pay | Source: Home / Self Care | Attending: Surgery

## 2018-11-09 ENCOUNTER — Ambulatory Visit: Payer: Self-pay | Admitting: Certified Registered"

## 2018-11-09 ENCOUNTER — Encounter: Payer: Self-pay | Admitting: *Deleted

## 2018-11-09 DIAGNOSIS — I10 Essential (primary) hypertension: Secondary | ICD-10-CM | POA: Insufficient documentation

## 2018-11-09 DIAGNOSIS — K429 Umbilical hernia without obstruction or gangrene: Secondary | ICD-10-CM | POA: Insufficient documentation

## 2018-11-09 DIAGNOSIS — Z87891 Personal history of nicotine dependence: Secondary | ICD-10-CM | POA: Insufficient documentation

## 2018-11-09 DIAGNOSIS — K4091 Unilateral inguinal hernia, without obstruction or gangrene, recurrent: Secondary | ICD-10-CM | POA: Insufficient documentation

## 2018-11-09 DIAGNOSIS — Z6832 Body mass index (BMI) 32.0-32.9, adult: Secondary | ICD-10-CM | POA: Insufficient documentation

## 2018-11-09 DIAGNOSIS — E669 Obesity, unspecified: Secondary | ICD-10-CM | POA: Insufficient documentation

## 2018-11-09 HISTORY — PX: INGUINAL HERNIA REPAIR: SHX194

## 2018-11-09 HISTORY — PX: UMBILICAL HERNIA REPAIR: SHX196

## 2018-11-09 SURGERY — REPAIR, HERNIA, INGUINAL, BILATERAL, LAPAROSCOPIC
Anesthesia: General | Laterality: Right

## 2018-11-09 MED ORDER — LIDOCAINE HCL (PF) 2 % IJ SOLN
INTRAMUSCULAR | Status: AC
  Filled 2018-11-09: qty 10

## 2018-11-09 MED ORDER — FENTANYL CITRATE (PF) 100 MCG/2ML IJ SOLN
INTRAMUSCULAR | Status: DC | PRN
  Administered 2018-11-09: 25 ug via INTRAVENOUS
  Administered 2018-11-09 (×2): 50 ug via INTRAVENOUS

## 2018-11-09 MED ORDER — SUGAMMADEX SODIUM 200 MG/2ML IV SOLN
INTRAVENOUS | Status: DC | PRN
  Administered 2018-11-09: 200 mg via INTRAVENOUS

## 2018-11-09 MED ORDER — CHLORHEXIDINE GLUCONATE CLOTH 2 % EX PADS: 6.0000 | MEDICATED_PAD | Freq: Once | CUTANEOUS | Status: DC

## 2018-11-09 MED ORDER — PROPOFOL 10 MG/ML IV BOLUS
INTRAVENOUS | Status: DC | PRN
  Administered 2018-11-09: 30 mg via INTRAVENOUS
  Administered 2018-11-09: 150 mg via INTRAVENOUS

## 2018-11-09 MED ORDER — MEPERIDINE HCL 50 MG/ML IJ SOLN: 6.2500 mg | INTRAMUSCULAR | Status: DC | PRN

## 2018-11-09 MED ORDER — LIDOCAINE HCL (CARDIAC) PF 100 MG/5ML IV SOSY
PREFILLED_SYRINGE | INTRAVENOUS | Status: DC | PRN
  Administered 2018-11-09: 100 mg via INTRAVENOUS

## 2018-11-09 MED ORDER — GABAPENTIN 300 MG PO CAPS
ORAL_CAPSULE | ORAL | Status: AC
  Administered 2018-11-09: 300 mg via ORAL
  Filled 2018-11-09: qty 1

## 2018-11-09 MED ORDER — FENTANYL CITRATE (PF) 250 MCG/5ML IJ SOLN
INTRAMUSCULAR | Status: AC
  Filled 2018-11-09: qty 5

## 2018-11-09 MED ORDER — ACETAMINOPHEN 500 MG PO TABS
1000.0000 mg | ORAL_TABLET | ORAL | Status: AC
  Administered 2018-11-09: 1000 mg via ORAL

## 2018-11-09 MED ORDER — EPHEDRINE SULFATE 50 MG/ML IJ SOLN
INTRAMUSCULAR | Status: AC
  Filled 2018-11-09: qty 1

## 2018-11-09 MED ORDER — ONDANSETRON HCL 4 MG/2ML IJ SOLN
INTRAMUSCULAR | Status: DC | PRN
  Administered 2018-11-09: 4 mg via INTRAVENOUS

## 2018-11-09 MED ORDER — PROPOFOL 10 MG/ML IV BOLUS
INTRAVENOUS | Status: AC
  Filled 2018-11-09: qty 20

## 2018-11-09 MED ORDER — GLYCOPYRROLATE 0.2 MG/ML IJ SOLN
INTRAMUSCULAR | Status: AC
  Filled 2018-11-09: qty 1

## 2018-11-09 MED ORDER — GLYCOPYRROLATE 0.2 MG/ML IJ SOLN
INTRAMUSCULAR | Status: DC | PRN
  Administered 2018-11-09: 0.2 mg via INTRAVENOUS

## 2018-11-09 MED ORDER — OXYCODONE-ACETAMINOPHEN 5-325 MG PO TABS: 1.0000 | ORAL_TABLET | ORAL | 0 refills | Status: DC | PRN

## 2018-11-09 MED ORDER — ROCURONIUM BROMIDE 50 MG/5ML IV SOLN
INTRAVENOUS | Status: AC
  Filled 2018-11-09: qty 1

## 2018-11-09 MED ORDER — DEXAMETHASONE SODIUM PHOSPHATE 10 MG/ML IJ SOLN
INTRAMUSCULAR | Status: AC
  Filled 2018-11-09: qty 1

## 2018-11-09 MED ORDER — DEXAMETHASONE SODIUM PHOSPHATE 10 MG/ML IJ SOLN
INTRAMUSCULAR | Status: DC | PRN
  Administered 2018-11-09: 10 mg via INTRAVENOUS

## 2018-11-09 MED ORDER — ROCURONIUM BROMIDE 100 MG/10ML IV SOLN
INTRAVENOUS | Status: DC | PRN
  Administered 2018-11-09: 50 mg via INTRAVENOUS
  Administered 2018-11-09 (×2): 10 mg via INTRAVENOUS
  Administered 2018-11-09: 5 mg via INTRAVENOUS

## 2018-11-09 MED ORDER — PHENYLEPHRINE HCL 10 MG/ML IJ SOLN
INTRAMUSCULAR | Status: DC | PRN
  Administered 2018-11-09: 100 ug via INTRAVENOUS
  Administered 2018-11-09: 50 ug via INTRAVENOUS
  Administered 2018-11-09: 100 ug via INTRAVENOUS

## 2018-11-09 MED ORDER — FENTANYL CITRATE (PF) 100 MCG/2ML IJ SOLN
25.0000 ug | INTRAMUSCULAR | Status: DC | PRN
  Administered 2018-11-09 (×4): 25 ug via INTRAVENOUS

## 2018-11-09 MED ORDER — MIDAZOLAM HCL 2 MG/2ML IJ SOLN
INTRAMUSCULAR | Status: DC | PRN
  Administered 2018-11-09: 2 mg via INTRAVENOUS

## 2018-11-09 MED ORDER — ONDANSETRON HCL 4 MG/2ML IJ SOLN
INTRAMUSCULAR | Status: AC
  Filled 2018-11-09: qty 2

## 2018-11-09 MED ORDER — LACTATED RINGERS IV SOLN
INTRAVENOUS | Status: DC
  Administered 2018-11-09 (×2): via INTRAVENOUS

## 2018-11-09 MED ORDER — SUGAMMADEX SODIUM 200 MG/2ML IV SOLN
INTRAVENOUS | Status: AC
  Filled 2018-11-09: qty 2

## 2018-11-09 MED ORDER — GABAPENTIN 300 MG PO CAPS
300.0000 mg | ORAL_CAPSULE | ORAL | Status: AC
  Administered 2018-11-09: 300 mg via ORAL

## 2018-11-09 MED ORDER — LIDOCAINE HCL 1 % IJ SOLN
INTRAMUSCULAR | Status: DC | PRN
  Administered 2018-11-09: 20 mL via INTRAMUSCULAR

## 2018-11-09 MED ORDER — MIDAZOLAM HCL 2 MG/2ML IJ SOLN
INTRAMUSCULAR | Status: AC
  Filled 2018-11-09: qty 2

## 2018-11-09 MED ORDER — ACETAMINOPHEN 500 MG PO TABS
ORAL_TABLET | ORAL | Status: AC
  Administered 2018-11-09: 1000 mg via ORAL
  Filled 2018-11-09: qty 2

## 2018-11-09 MED ORDER — CEFAZOLIN SODIUM-DEXTROSE 2-4 GM/100ML-% IV SOLN
INTRAVENOUS | Status: AC
  Filled 2018-11-09: qty 100

## 2018-11-09 MED ORDER — LIDOCAINE HCL (PF) 1 % IJ SOLN
INTRAMUSCULAR | Status: AC
  Filled 2018-11-09: qty 30

## 2018-11-09 MED ORDER — FAMOTIDINE 20 MG PO TABS
20.0000 mg | ORAL_TABLET | Freq: Once | ORAL | Status: AC
  Administered 2018-11-09: 20 mg via ORAL

## 2018-11-09 MED ORDER — FENTANYL CITRATE (PF) 100 MCG/2ML IJ SOLN
INTRAMUSCULAR | Status: AC
  Administered 2018-11-09: 25 ug via INTRAVENOUS
  Filled 2018-11-09: qty 2

## 2018-11-09 MED ORDER — FAMOTIDINE 20 MG PO TABS
ORAL_TABLET | ORAL | Status: AC
  Administered 2018-11-09: 20 mg via ORAL
  Filled 2018-11-09: qty 1

## 2018-11-09 MED ORDER — EPHEDRINE SULFATE 50 MG/ML IJ SOLN
INTRAMUSCULAR | Status: DC | PRN
  Administered 2018-11-09 (×2): 10 mg via INTRAVENOUS

## 2018-11-09 MED ORDER — OXYCODONE HCL 5 MG PO TABS: 5.0000 mg | ORAL_TABLET | Freq: Once | ORAL | Status: DC | PRN

## 2018-11-09 MED ORDER — BUPIVACAINE HCL (PF) 0.5 % IJ SOLN
INTRAMUSCULAR | Status: AC
  Filled 2018-11-09: qty 30

## 2018-11-09 MED ORDER — PROMETHAZINE HCL 25 MG/ML IJ SOLN: 6.2500 mg | INTRAMUSCULAR | Status: DC | PRN

## 2018-11-09 MED ORDER — OXYCODONE HCL 5 MG/5ML PO SOLN: 5.0000 mg | Freq: Once | ORAL | Status: DC | PRN

## 2018-11-09 SURGICAL SUPPLY — 45 items
BLADE CLIPPER SURG (BLADE) ×4 IMPLANT
BLADE SURG 11 STRL SS SAFETY (MISCELLANEOUS) ×4 IMPLANT
BLADE SURG 15 STRL LF DISP TIS (BLADE) ×4 IMPLANT
BLADE SURG 15 STRL SS (BLADE) ×4
CHLORAPREP W/TINT 26ML (MISCELLANEOUS) ×4 IMPLANT
DECANTER SPIKE VIAL GLASS SM (MISCELLANEOUS) ×8 IMPLANT
DERMABOND ADVANCED (GAUZE/BANDAGES/DRESSINGS) ×2
DERMABOND ADVANCED .7 DNX12 (GAUZE/BANDAGES/DRESSINGS) ×2 IMPLANT
DEVICE SECURE STRAP 25 ABSORB (INSTRUMENTS) ×4 IMPLANT
DISSECTOR KITTNER STICK (MISCELLANEOUS) ×4 IMPLANT
DISSECTORS/KITTNER STICK (MISCELLANEOUS) ×8
DRAPE LAPAROTOMY 77X122 PED (DRAPES) ×4 IMPLANT
ELECT CAUTERY BLADE 6.4 (BLADE) ×4 IMPLANT
ELECT REM PT RETURN 9FT ADLT (ELECTROSURGICAL) ×4
ELECTRODE REM PT RTRN 9FT ADLT (ELECTROSURGICAL) ×2 IMPLANT
GLOVE BIO SURGEON STRL SZ7 (GLOVE) ×4 IMPLANT
GLOVE BIOGEL PI IND STRL 7.5 (GLOVE) ×2 IMPLANT
GLOVE BIOGEL PI INDICATOR 7.5 (GLOVE) ×2
GLOVE INDICATOR 7.5 STRL GRN (GLOVE) ×4 IMPLANT
GOWN STRL REUS W/ TWL LRG LVL3 (GOWN DISPOSABLE) ×4 IMPLANT
GOWN STRL REUS W/TWL LRG LVL3 (GOWN DISPOSABLE) ×4
KIT TURNOVER KIT A (KITS) ×4 IMPLANT
LABEL OR SOLS (LABEL) ×4 IMPLANT
MESH 3DMAX 4X6 RT LRG (Mesh General) ×4 IMPLANT
NEEDLE HYPO 22GX1.5 SAFETY (NEEDLE) ×4 IMPLANT
NS IRRIG 1000ML POUR BTL (IV SOLUTION) ×4 IMPLANT
NS IRRIG 500ML POUR BTL (IV SOLUTION) IMPLANT
PACK BASIN MINOR ARMC (MISCELLANEOUS) ×4 IMPLANT
PACK LAP CHOLECYSTECTOMY (MISCELLANEOUS) ×4 IMPLANT
SCISSORS METZENBAUM CVD 33 (INSTRUMENTS) ×4 IMPLANT
SET TUBE SMOKE EVAC HIGH FLOW (TUBING) ×4 IMPLANT
SLEEVE ENDOPATH XCEL 5M (ENDOMECHANICALS) ×8 IMPLANT
SUT ETHIBOND 0 MO6 C/R (SUTURE) ×4 IMPLANT
SUT MNCRL 4-0 (SUTURE) ×4
SUT MNCRL 4-0 27XMFL (SUTURE) ×4
SUT MNCRL AB 4-0 PS2 18 (SUTURE) ×4 IMPLANT
SUT VIC AB 1 CTX 27 (SUTURE) ×4 IMPLANT
SUT VIC AB 2-0 SH 27 (SUTURE) ×4
SUT VIC AB 2-0 SH 27XBRD (SUTURE) ×4 IMPLANT
SUT VIC AB 3-0 SH 27 (SUTURE) ×2
SUT VIC AB 3-0 SH 27X BRD (SUTURE) ×2 IMPLANT
SUTURE MNCRL 4-0 27XMF (SUTURE) ×4 IMPLANT
SYR 10ML LL (SYRINGE) ×4 IMPLANT
TRAY FOLEY MTR SLVR 16FR STAT (SET/KITS/TRAYS/PACK) ×4 IMPLANT
TROCAR ENDOPATH XCEL 12X100 BL (ENDOMECHANICALS) ×4 IMPLANT

## 2018-11-09 NOTE — Anesthesia Postprocedure Evaluation (Signed)
Anesthesia Post Note  Patient: [name] [facility]  Procedure(s) Performed: LAPAROSCOPIC INGUINAL HERNIA REPAIR WITH MESH (Right ) POSSIBLE OPEN REPAIR UMBILICAL HERNIA INTERPRETER APPOINTMENT (N/A )  Patient location during evaluation: PACU Anesthesia Type: General Level of consciousness: awake and alert and oriented Pain management: pain level controlled Vital Signs Assessment: post-procedure vital signs reviewed and stable Respiratory status: spontaneous breathing, nonlabored ventilation and respiratory function stable Cardiovascular status: blood pressure returned to baseline and stable Postop Assessment: no signs of nausea or vomiting Anesthetic complications: no     Last Vitals:  Vitals:   11/09/18 1359 11/09/18 1404  BP: 126/81   Pulse: 89 88  Resp: 14 14  Temp:    SpO2: 93% 94%    Last Pain:  Vitals:   11/09/18 1404  TempSrc:   PainSc: 4                  [name]

## 2018-11-09 NOTE — Op Note (Signed)
DATE OF PROCEDURE: 11/09/2018  ATTENDING Surgeon(s): Vickie Epley, MD  ASSISTANT(S): Jules Husbands, MD (requested and appreciated due to the technical challenges associated with repair of this patient's hernias and no other qualified assistant available)  ANESTHESIA: GETA  PRE-OPERATIVE DIAGNOSIS: Recurrent increasingly symptomatic reducible Right inguinal hernia with non-recurrent reducible umbilical hernia (FXT-02'I: K40.91, K42.9)   POST-OPERATIVE DIAGNOSIS: Recurrent increasingly symptomatic reducible Right inguinal hernia with non-recurrent reducible umbilical hernia (OXB-35'H: K40.91, K42.9)   PROCEDURE(S):  1.) Trans-abdominal laparoscopic repair of symptomatic (painful) reducible Right inguinal hernia with mesh (cpt: 29924) 2.) Primary (suture) repair of non-recurrent increasingly symptomatic reducible umbilical hernia (cpt: 26834)   INTRAOPERATIVE FINDINGS: Moderately large chronic reducible Right direct inguinal hernia containing peritoneal fat and sac at the time of surgery and 1.5 cm reducible umbilical hernia containing peritoneal fat and sac at the time of surgery   INTRAVENOUS FLUIDS: 1000 mL crystalloid    ESTIMATED BLOOD LOSS: Minimal (<30 mL)   URINE OUTPUT: 100 mL   SPECIMENS: None   IMPLANTS: Bilateral inguinal Medium-size Bard 3D Max polypropylene mesh x 2 (one per each side)   DRAINS: None    COMPLICATIONS: None apparent    CONDITION AT END OF PROCEDURE: Hemodynamically stable and extubated    DISPOSITION OF PATIENT: PACU   INDICATIONS FOR PROCEDURE:   Patient is a 63 y.o. male who presented with a recurrent large symptomatic (painful) Right inguinal hernia. On exam, patient was noted to have large easily reducible Right inguinal hernia with Right groin tenderness to palpation s/p prior open repair of Right inguinal hernia with mesh as well as a non-recurrent, also increasingly symptomatic (painful) reducible umbilical hernia. Considering recurrence and  patient's desire to return to work/activities with heavy lifting as soon as possible, laparoscopic repair of patient's symptomatic recurrent Right inguinal hernia was offered, along with open primary (suture) repair of his umbilical hernia. All risks, benefits, and alternatives to above procedures were discussed with the patient, all of patient's questions were answered to his expressed satisfaction, and informed consent was obtained and documented.   DETAILS OF PROCEDURE: Patient was brought to the operating suite and appropriately identified. General anesthesia was administered along with appropriate pre-operative antibiotics, and endotracheal intubation was performed by anesthetist. In supine position, operative site was prepped and draped in the usual sterile fashion, and following a brief time out, local anesthetic was injected superior to the umbilicus, and a 2 cm transverse curvilinear incision was made using a #15 blade scalpel and extended deep through subcutaneous tissues using blunt dissection and electrocautery. The hernia sac was then dissected from the undersurface of the umbilical skin and stalk, and the fascial defect was cleared of all omental adhesions. The peritoneal surface was likewise cleared of all omental adhesions. Entry into the peritoneal cavity was visually confirmed with no bowel noted to be in the vicinity of the incision, and two 0-0 Vicryl stay sutures (one at each corner of the incision/hernia) were used to secure a Hassan cannula, which was inserted through the umbilical hernia fascial defect. Peritoneal cavity was insufflated.   Upon insufflation of the abdominal cavity with carbon dioxide to a well-tolerated pressure of 12-15 mmHg, 10 mm 30-degree laparoscope was inserted via supra-umbilical 12 mm port site and used to inspect the abdominal cavity and its contents with no injuries from insertion of the first trochar noted. Two additional 5 mm trocars were inserted in the LLQ,  one at the level of the ASIS and the other just below the level  of the umbilicus in order to triangulate towards the unilateral Right inguinal hernia. The table was then placed in Trendelenburg position with the Right side up.  The median umbilical ligament was divided sharply with electrocautery to achieve optimal exposure. The peritoneum was then incised with endoscopic scissors and electrocautery along a line 2 cm above the superior edge of the hernia defect, extending from the medial umbilical ligament to the anterior superior iliac spine. Appropriate peritoneal flaps were created and mobilized inferiorly and superiorly, carefully exposing the epigastric vessels. The pubic symphysis was identified, and Cooper's ligament was dissected to its junction with the iliac vein. This dissection was then continued inferiorly to the iliopubic tract with care taken to avoid injury to the femoral branch of the genitofemoral nerve and the lateral femoral cutaneous nerve. The direct hernia sac was noted to be moderately large and was systematically mobilized from the adherent cord structures and reduced into the peritoneal cavity by applying gentle traction.   A large-size Bard 3D Max polypropylene mesh was rolled longitudinally, and passed through the camera trocar. The mesh was then placed along the inferior aspect of the working space and unrolled into place to completely cover the direct, indirect, and femoral spaces. The mesh was then tacked into place medially to Cooper's ligament, taking care to avoid any tacks to or manipulation in the vicinity of the femoral vessels, epigastric vessels, or nerves laterally. The peritoneal flap was then re-approximated over the mesh and secured using absorbable tacks in similarly safe positions.   Hemostasis was confirmed, instruments were removed, and abdominal cavity was desuflated. 0-0 Vicryl buried interrupted sutures were used to repair the umbilical hernia fascial defect.  All port sites were irrigated/cleaned, and additional local anesthetic was injected at each incision. The umbilicus was then invaginated using 2-0 Vicryl suture from the dermis to fascia, 3-0 Vicryl was used to re-approximate dermis at 12 mm port site, and buried interrupted 4-0 Monocryl suture staples was used to re-approximate skin at the 5 mm port sites. Skin was then cleaned, dried, and sterile skin glue was applied and allowed to dry. Patient was then safely able to be awakened, extubated, and transferred to PACU for post-operative monitoring and care.   I was present for all aspects of the above procedure, and no operative complications were apparent.

## 2018-11-09 NOTE — Transfer of Care (Signed)
Immediate Anesthesia Transfer of Care Note  Patient: [facility]  Procedure(s) Performed: LAPAROSCOPIC INGUINAL HERNIA REPAIR WITH MESH (Right ) POSSIBLE OPEN REPAIR UMBILICAL HERNIA INTERPRETER APPOINTMENT (N/A )  Patient Location: PACU  Anesthesia Type:General  Level of Consciousness: drowsy and patient cooperative  Airway & Oxygen Therapy: Patient Spontanous Breathing and Patient connected to face mask oxygen  Post-op Assessment: Report given to RN and Post -op Vital signs reviewed and stable  Post vital signs: Reviewed and stable  Last Vitals:  Vitals Value Taken Time  BP 116/57 11/09/2018  1:20 PM  Temp 36.9 C 11/09/2018  1:20 PM  Pulse 80 11/09/2018  1:24 PM  Resp 12 11/09/2018  1:24 PM  SpO2 98 % 11/09/2018  1:24 PM  Vitals shown include unvalidated device data.  Last Pain:  Vitals:   11/09/18 0836  TempSrc: Oral  PainSc: 0-No pain         Complications: No apparent anesthesia complications

## 2018-11-09 NOTE — Interval H&P Note (Signed)
History and Physical Interval Note:  11/09/2018 10:48 AM  [name]  has presented today for surgery, with the diagnosis of RIGHT INGUINAL NONRECURRENT  UMBILICAL HERNIAS  The various methods of treatment have been discussed with the patient and family. After consideration of risks, benefits and other options for treatment, the patient has consented to  Procedure(s): [place] (Right) POSSIBLE OPEN REPAIR UMBILICAL HERNIA INTERPRETER APPOINTMENT (N/A) as a surgical intervention .  The patient's history has been reviewed, patient examined, no change in status, stable for surgery.  I have reviewed the patient's chart and labs.  Questions were answered to the patient's satisfaction.     Vickie Epley

## 2018-11-09 NOTE — Discharge Instructions (Addendum)
In addition to included general post-operative instructions for Laparoscopic Repair of Right Inguinal Hernia with mesh and Open Suture Repair of Umbilical Hernia without mesh,  Diet: Resume home heart healthy diet.   Activity: No heavy lifting >15 - 20 pounds (children, pets, laundry, garbage) or strenuous activity until follow-up, but light activity and walking are encouraged. Do not drive or drink alcohol if taking narcotic pain medications.  Wound care: 2 days after surgery (Sunday afternoon, 2/2), you may shower/get incision wet with soapy water and pat dry (do not rub incisions), but no baths or submerging incision underwater until follow-up.   Medications: Resume all home medications. For mild to moderate pain: acetaminophen (Tylenol) or ibuprofen/naproxen (if no kidney disease). Combining Tylenol with alcohol can substantially increase your risk of causing liver disease. Narcotic pain medications, if prescribed, can be used for severe pain, though may cause nausea, constipation, and drowsiness. Do not combine Tylenol and Percocet (or similar) within a 6 hour period as Percocet (and similar) contain(s) Tylenol. If you do not need the narcotic pain medication, you do not need to fill the prescription.  Call office [phone]) at any time if any questions, worsening pain, fevers/chills, bleeding, drainage from incision site, or other concerns.   AMBULATORY SURGERY  DISCHARGE INSTRUCTIONS   1) The drugs that you were given will stay in your system until tomorrow so for the next 24 hours you should not:  A) Drive an automobile B) Make any legal decisions C) Drink any alcoholic beverage   2) You may resume regular meals tomorrow.  Today it is better to start with liquids and gradually work up to solid foods.  You may eat anything you prefer, but it is better to start with liquids, then soup and crackers, and gradually work up to solid foods.   3) Please notify your doctor immediately  if you have any unusual bleeding, trouble breathing, redness and pain at the surgery site, drainage, fever, or pain not relieved by medication.    4) Additional Instructions:        Please contact your physician with any problems or Same Day Surgery at [phone], Monday through Friday 6 am to 4 pm, or [place] at [facility] number at [phone].

## 2018-11-09 NOTE — Anesthesia Post-op Follow-up Note (Signed)
Anesthesia QCDR form completed.        

## 2018-11-09 NOTE — H&P (Signed)
SURGICAL PRE-OPERATIVE HISTORY & PHYSICAL  HPI:  63 y.o. male presented to outpatient surgical clinic for evaluation of his umbilical hernia, though during his appointment also inquired regarding constipation, proctitis, and Right scrotal pain. Patient reported he'd experienced constipation x 2 years with intermittent small amounts of blood per rectum when he strains to pass BM's, for which he recently underwent screening colonoscopy. 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 Over the past 8 months, patient also said he has experienced Right groin pain, which radiates to his Right scrotum. He says he underwent open repair of his Right inguinal hernia in [place] 30 years ago and has more recently had to manually self-reduce a bulge of his Right groin. Lastly, patient also inquired regarding a non-painful asymptomatic bulge he noticed from his epigastrium to above his umbilicus. Though patient denied straining with urination, he was referred to urology for evaluation/management of his chronic prostatitis. Patient otherwise denied any frequent coughing, though stated he quit smoking 20 years ago after smoking 1 ppd x 15 years. He denies any fever/chills, N/V, CP, or SOB. Specifically, patient says he is able to ambulate at least 30 minutes - 2 hours and ascend/descend 1 - 2 flights of steps without experiencing CP or SOB.  Patient was again today seen and examined with no changes since prior evaluation.   Review of Systems:  Constitutional: denies any other weight loss, fever, chills, or sweats  Eyes: denies any other vision changes, history of eye injury  ENT: denies sore throat, hearing problems  Respiratory: denies shortness of breath, wheezing  Cardiovascular: denies chest pain, palpitations  Gastrointestinal: abdominal pain, N/V, and bowel function as per  HPI Musculoskeletal: denies any other joint pains or cramps  Skin: Denies any other rashes or skin discolorations  Neurological: denies any other headache, dizziness, weakness  Psychiatric: denies any other depression, anxiety  All other review of systems: otherwise negative   Vital Signs:  BP (!) 161/99   Pulse 86   Temp 98.4 F (36.9 C) (Oral)   Resp 16   Ht 5\' 5"  (1.651 m)   Wt 89.9 kg   SpO2 99%   BMI 33.00 kg/m   Physical Exam:  Constitutional:  -- Overweight body habitus  -- Awake, alert, and oriented x3  Eyes:  -- Pupils equally round and reactive to light  -- No scleral icterus  Ear, nose, throat:  -- No jugular venous distension  -- No nasal drainage, bleeding Pulmonary:  -- No crackles -- Equal breath sounds bilaterally -- Breathing non-labored at rest Cardiovascular:  -- S1, S2 present  -- No pericardial rubs  Gastrointestinal:  -- Soft, nontender, and non-distended with no guarding/rebound tenderness  -- No abdominal masses appreciated, pulsatile or otherwise, except easily reducible umbilical and recurrent Right inguinal hernias Musculoskeletal / Integumentary:  -- Wounds or skin discoloration: None appreciated  -- Extremities: B/L UE and LE FROM, hands and feet warm, no edema  Neurologic:  -- Motor function: intact and symmetric  -- Sensation: intact and symmetric   Laboratory studies:  CBC Latest Ref Rng & Units 10/26/2013 10/19/2012 03/18/2011  WBC 4.0 - 10.5 K/uL 12.8(H) 9.5 -  Hemoglobin 13.0 - 17.0 g/dL 15.7 16.6 14.3  Hematocrit 39.0 - 52.0 % 43.5 47.2 42.0  Platelets 150 - 400 K/uL 164 160 -   CMP Latest Ref Rng & Units 06/28/2018 06/14/2018 10/26/2013  Glucose 65 - 99 mg/dL 84 76 116(H)  BUN 8 - 27 mg/dL 14 11 16   Creatinine 0.76 - 1.27 mg/dL 0.68(L) 0.79 0.84  Sodium 134 - 144 mmol/L 142 142 144  Potassium 3.5 - 5.2 mmol/L 4.4 4.2 4.0  Chloride 96 - 106 mmol/L 105 103 105  CO2 20 - 29 mmol/L 22 25 26   Calcium 8.6 - 10.2 mg/dL 9.1 9.1 8.5   Total Protein 6.0 - 8.5 g/dL - 7.0 -  Total Bilirubin 0.0 - 1.2 mg/dL - 0.7 -  Alkaline Phos 39 - 117 IU/L - 72 -  AST 0 - 40 IU/L - 20 -  ALT 0 - 44 IU/L - 24 -   Imaging:  Colonoscopy ([place], 09/13/2018) - Two 6 mm polyps in the ascending colon and in the cecum, removed with a cold snare. Resected and retrieved. - Normal mucosa in the entire examined colon. Biopsied. - The distal rectum and anal verge are normal on retroflexion view.  Colonoscopic Pathology (09/13/2018) A. DUODENUM; COLD BIOPSY:  - ENTERIC MUCOSA WITH PRESERVED VILLOUS ARCHITECTURE AND NO SIGNIFICANT  HISTOPATHOLOGIC CHANGE.  - NEGATIVE FOR FEATURES OF CELIAC, DYSPLASIA, AND MALIGNANCY.   B. STOMACH, ANTRUM; COLD BIOPSY:  - GASTRIC ANTRAL MUCOSA WITH MILD REACTIVE GASTROPATHY.  - NEGATIVE FOR H. PYLORI, DYSPLASIA, AND MALIGNANCY.   C. STOMACH, BODY; COLD BIOPSY:  - GASTRIC OXYNTIC MUCOSA WITH NO SIGNIFICANT HISTOPATHOLOGIC CHANGE.  - NEGATIVE FOR H. PYLORI, DYSPLASIA, AND MALIGNANCY.   D. COLON POLYP, CECUM; COLD SNARE:  - BENIGN COLONIC TISSUE SHOWING SUPERFICIAL HYPERPLASTIC CHANGES.  - NEGATIVE FOR ADENOMATOUS CHANGE AND MALIGNANCY.   E. COLON POLYP, ASCENDING; COLD SNARE:  - TUBULAR ADENOMA.  - NEGATIVE FOR HIGH-GRADE DYSPLASIA AND MALIGNANCY.   F. COLON, RANDOM; COLD BIOPSY:  - BENIGN COLONIC TISSUE WITH NO SIGNIFICANT HISTOPATHOLOGIC CHANGE.  - NEGATIVE FOR MICROSCOPIC COLITIS, DYSPLASIA, AND MALIGNANCY.  Assessment:  63 y.o. yo Male with a problem list including...      Patient Active Problem List   Diagnosis Date Noted  . Abdominal bloating   . Chronic diarrhea   . Colon cancer screening   . Hypertension 08/17/2018  . Thyroid disease 08/17/2018  . Hyperlipidemia 08/17/2018  . Umbilical hernia without obstruction or gangrene 08/17/2018  . Unilateral recurrent inguinal hernia without obstruction or gangrene 08/17/2018  . Constipation 08/17/2018    Assessment/Plan: (ICD-10's:  K40.91, K42.9) 63 y.o.malewith increasingly symptomatic(painful)reduciblerecurrentRight inguinal hernia> non-recurrent umbilical hernia, complicated by chronic symptomatic prostatitis and by comorbidities includingobesity (BMI >32), HTN, HLD, thyroid disease (not otherwise specified), erectile dysfunction, generalized anxiety disorder, and major depression disorder.  - NPO, IV fluids - all risks, benefits, and alternatives torepair ofrecurrent Right inguinal and umbilicalherniaswith meshwere discussed with the patient, all of his questions were answered to patient's expressed satisfaction, patient expresses he wishes to proceed, and informed consent was obtained. - will plan for laparoscopic (TAP) repair ofrecurrent Right inguinal herniawith mesh and primary vs mesh repair of umbilical hernia pending anesthesia, OR availability,and urology management of prostatitis to reduce risks of mesh infection if hernia mesh placed in the context of active/ongoing urinary tract infection - anticipatereturn to clinic2weeks after above planned surgery - instructed to call if any questions or concerns  All of the above recommendations were discussed with the patient, and all of patient's questions were answered to his expressed satisfaction.  -- Marilynne Drivers Rosana Hoes, MD, [place]: [facility] - Partnering for exceptional care. Office: [phone]

## 2018-11-09 NOTE — Anesthesia Procedure Notes (Signed)
Procedure Name: Intubation Date/Time: 11/09/2018 11:16 AM Performed by: Jackalyn Lombard, RN Pre-anesthesia Checklist: Patient identified, Emergency Drugs available, Suction available and Patient being monitored Patient Re-evaluated:Patient Re-evaluated prior to induction Oxygen Delivery Method: Circle system utilized Preoxygenation: Pre-oxygenation with 100% oxygen Induction Type: IV induction Ventilation: Mask ventilation without difficulty and Oral airway inserted - appropriate to patient size Laryngoscope Size: Sabra Heck and 2 Grade View: Grade I Tube type: Oral Tube size: 7.5 mm Number of attempts: 1 Airway Equipment and Method: Stylet Placement Confirmation: ETT inserted through vocal cords under direct vision,  positive ETCO2 and breath sounds checked- equal and bilateral Secured at: 21 cm Tube secured with: Tape Dental Injury: Teeth and Oropharynx as per pre-operative assessment

## 2018-11-09 NOTE — Anesthesia Preprocedure Evaluation (Signed)
Anesthesia Evaluation  Patient identified by MRN, date of birth, ID band Patient awake    Reviewed: Allergy & Precautions, NPO status , Patient's Chart, lab work & pertinent test results  History of Anesthesia Complications Negative for: history of anesthetic complications  Airway Mallampati: II  TM Distance: >3 FB Neck ROM: Full    Dental  (+) Poor Dentition   Pulmonary neg sleep apnea, neg COPD, former smoker,    breath sounds clear to auscultation- rhonchi (-) wheezing      Cardiovascular Exercise Tolerance: Good hypertension, (-) CAD, (-) Past MI, (-) Cardiac Stents and (-) CABG  Rhythm:Regular Rate:Normal - Systolic murmurs and - Diastolic murmurs    Neuro/Psych neg Seizures PSYCHIATRIC DISORDERS Anxiety Depression negative neurological ROS     GI/Hepatic negative GI ROS, Neg liver ROS,   Endo/Other  negative endocrine ROSneg diabetes  Renal/GU negative Renal ROS     Musculoskeletal negative musculoskeletal ROS (+)   Abdominal (+) + obese,   Peds  Hematology negative hematology ROS (+)   Anesthesia Other Findings Past Medical History: 10/26/2013: Amputation of left thumb No date: Anxiety No date: Depression No date: ED (erectile dysfunction) No date: Hyperlipidemia No date: Hypertension     Comment:  denies No date: Thyroid disease No date: Urinary incontinence   Reproductive/Obstetrics                             Anesthesia Physical Anesthesia Plan  ASA: II  Anesthesia Plan: General   Post-op Pain Management:    Induction: Intravenous  PONV Risk Score and Plan: 1 and Ondansetron and Midazolam  Airway Management Planned: Oral ETT  Additional Equipment:   Intra-op Plan:   Post-operative Plan: Extubation in OR  Informed Consent: I have reviewed the patients History and Physical, chart, labs and discussed the procedure including the risks, benefits and alternatives  for the proposed anesthesia with the patient or authorized representative who has indicated his/her understanding and acceptance.     Dental advisory given  Plan Discussed with: CRNA and Anesthesiologist  Anesthesia Plan Comments:         Anesthesia Quick Evaluation

## 2018-11-22 ENCOUNTER — Ambulatory Visit (INDEPENDENT_AMBULATORY_CARE_PROVIDER_SITE_OTHER): Payer: Self-pay | Admitting: Surgery

## 2018-11-22 ENCOUNTER — Other Ambulatory Visit: Payer: Self-pay

## 2018-11-22 ENCOUNTER — Encounter: Payer: Self-pay | Admitting: Surgery

## 2018-11-22 VITALS — BP 133/89 | HR 73 | Temp 99.3°F | Resp 16 | Ht 65.0 in | Wt 195.8 lb

## 2018-11-22 DIAGNOSIS — Z4889 Encounter for other specified surgical aftercare: Secondary | ICD-10-CM

## 2018-11-22 DIAGNOSIS — K4091 Unilateral inguinal hernia, without obstruction or gangrene, recurrent: Secondary | ICD-10-CM

## 2018-11-22 DIAGNOSIS — K429 Umbilical hernia without obstruction or gangrene: Secondary | ICD-10-CM

## 2018-11-22 NOTE — Progress Notes (Signed)
Surgical Clinic Progress/Follow-up Note   HPI:  63 y.o. Male presents to clinic for post-op follow-up 14 Days s/p trans-abdominal laparoscopic repair of his increasingly symptomatic (painful) reducible recurrent Rightinguinal herniawith mesh and primary (suture) repair of non-recurrent increasingly symptomatic reducible umbilical hernia Rosana Hoes, 11/09/2018). Patient reports complete resolution of pre-operative pain without any further bulges and has been tolerating regular diet with +flatus and normal BM's, denies N/V, fever/chills, CP, or SOB.  Review of Systems:  Constitutional: denies fever/chills  Respiratory: denies shortness of breath, wheezing  Cardiovascular: denies chest pain, palpitations  Gastrointestinal: abdominal pain, N/V, and bowel function as per interval history Skin: Denies any other rashes or skin discolorations except post-surgical wounds as per interval history  Vital Signs:  BP 133/89   Pulse 73   Temp 99.3 F (37.4 C) (Temporal)   Resp 16   Ht 5\' 5"  (1.651 m)   Wt 195 lb 12.8 oz (88.8 kg)   SpO2 95%   BMI 32.58 kg/m    Physical Exam:  Constitutional:  -- Overweight non-obese body habitus  -- Awake, alert, and oriented x3  Pulmonary:  -- No crackles -- Equal breath sounds bilaterally -- Breathing non-labored at rest Cardiovascular:  -- S1, S2 present  -- No pericardial rubs  Gastrointestinal:  -- Soft and non-distended, non-tender to palpation, no guarding/rebound tenderness -- Post-surgical incisions all well-approximated without any peri-incisional erythema or drainage -- No abdominal masses appreciated, pulsatile or otherwise  Musculoskeletal / Integumentary:  -- Wounds or skin discoloration: None appreciated except post-surgical incisions as described above (GI) -- Extremities: B/L UE and LE FROM, hands and feet warm, no edema   Imaging: No new pertinent imaging available for review  Assessment:  63 y.o. yo Male with a problem list  including...  Patient Active Problem List   Diagnosis Date Noted  . Prostatitis 09/20/2018  . Abdominal bloating   . Chronic diarrhea   . Colon cancer screening   . Hypertension 08/17/2018  . Thyroid disease 08/17/2018  . Hyperlipidemia 08/17/2018  . Umbilical hernia without obstruction or gangrene 08/17/2018  . Unilateral recurrent inguinal hernia without obstruction or gangrene 08/17/2018  . Constipation 08/17/2018    presents to clinic for post-op follow-up evaluation, doing very well 14 Days s/p trans-abdominal laparoscopic repair of his increasingly symptomatic (painful) reducible recurrent Rightinguinal herniawith mesh and primary (suture) repair of non-recurrent increasingly symptomatic reducible umbilical hernia Rosana Hoes, 11/09/2018).  Plan:              - advance diet as tolerated              - okay to submerge incisions under water (baths, swimming) prn             - no heavy lifting >40 lbs x 4 more weeks, after which may gradually resume all activities without restrictions             - apply sunblock particularly to incisions with sun exposure to reduce pigmentation of scars             - return to clinic as needed, instructed to call office if any questions or concerns  All of the above recommendations were discussed with the patient, and all of patient's questions were answered to his expressed satisfaction via certified in-person medical Spanish-English translation services.  -- Marilynne Drivers Rosana Hoes, MD, [place]: [facility] - Partnering for exceptional care. Office: [phone]

## 2018-11-22 NOTE — Patient Instructions (Signed)

## 2018-11-26 ENCOUNTER — Ambulatory Visit (INDEPENDENT_AMBULATORY_CARE_PROVIDER_SITE_OTHER): Payer: Self-pay | Admitting: Urology

## 2018-11-26 ENCOUNTER — Encounter: Payer: Self-pay | Admitting: Family Medicine

## 2018-11-26 ENCOUNTER — Encounter: Payer: Self-pay | Admitting: Urology

## 2018-11-26 VITALS — BP 122/79 | HR 76 | Ht 65.0 in | Wt 195.0 lb

## 2018-11-26 DIAGNOSIS — N3281 Overactive bladder: Secondary | ICD-10-CM

## 2018-11-26 LAB — URINALYSIS, COMPLETE
Bilirubin, UA: NEGATIVE
Glucose, UA: NEGATIVE
Ketones, UA: NEGATIVE
Leukocytes, UA: NEGATIVE
NITRITE UA: NEGATIVE
Protein, UA: NEGATIVE
Specific Gravity, UA: 1.02 (ref 1.005–1.030)
Urobilinogen, Ur: 0.2 mg/dL (ref 0.2–1.0)
pH, UA: 5 (ref 5.0–7.5)

## 2018-11-26 LAB — MICROSCOPIC EXAMINATION
Bacteria, UA: NONE SEEN
Epithelial Cells (non renal): NONE SEEN /hpf (ref 0–10)

## 2018-11-26 MED ORDER — OXYBUTYNIN CHLORIDE ER 10 MG PO TB24: 10.0000 mg | ORAL_TABLET | Freq: Every day | ORAL | 11 refills | Status: DC

## 2018-11-26 NOTE — Progress Notes (Addendum)
11/26/2018 8:41 AM   [name] [birth year] [id number]  Referring provider: Antony Blackbird, MD [place], [place] [address]  Chief Complaint  Patient presents with  . Over Active Bladder    follow up    HPI: [name]: In 2018, he was seen by urology specialty care in [facility] and diagnosed with chronic prostatitis/pelvic pain syndrome. He underwent cystoscopy with hydrodistention on January 15, 2018.  He was then evaluated in 2019 through [facility] in [place].  He was having the same complaints and was tried on Flomax, finasteride and Elmiron without relief.  He underwent MRI and CT urogram's which demonstrated benign cysts.  A TRUS showed a 30 cc prostate.  He underwent cystoscopy and was noted to have erythema at the bladder base, but biopsies and cytology were negative. He also had ED for which he was taking sildenafil.  I last saw him on 08/09/2018 for balanitis, urinary frequency and microscopic hematuria. At that time I gave him a trial of Myrbetriq 25mg  and a prescription for Mycolog cream. Today he reports that after finishing a trial of Myrbetriq he noted no change in his symptoms. He reports that he is experiencing nocturia x 8 (last night x 6 for the first time) and daytime frequency x 8-10+. He reports penile pain with urination and flank pain when he is unable to void. While sitting for extended periods of time he reports testicular pain and urgency. He notes that when he is sexually aroused he immediately experiences urgency as well.  Patient has had trouble with erections.  Patient has painful intercourse.  Patient had normal CT scan.  Patient may be having trouble with Peyronie's.  Patient has been cleared for hematuria.  Day Patient continues to void every 10 minutes and has difficulty to sit for 1 hour.  He gets a lot of testicular and perineal pressure.  He gets up 8 times a night and denies ankle edema.  By history he is  not tried oxybutynin or tolterodine.  He still has erection issues as noted.  He smoked many years ago  Modifying factors: There are no other modifying factors  Associated signs and symptoms: There are no other associated signs and symptoms Aggravating and relieving factors: There are no other aggravating or relieving factors Severity: Moderate Duration: Persistent     PMH: Past Medical History:  Diagnosis Date  . 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 Anxiety   . Depression   . ED (erectile dysfunction)   . Hyperlipidemia   . Hypertension    denies  . Thyroid disease   . Umbilical hernia without obstruction or gangrene 08/17/2018  . Unilateral recurrent inguinal hernia without obstruction or gangrene 08/17/2018  . Urinary incontinence     Surgical History: Past Surgical History:  Procedure Laterality Date  . COLONOSCOPY WITH PROPOFOL N/A 09/13/2018   Procedure: COLONOSCOPY WITH PROPOFOL;  Surgeon: Lin Landsman, MD;  Location: [facility] END[facility]COPY;  Service: Gastroenterology;  Laterality: N/A;  . ESOPHAGOGASTRODUODENOSCOPY (EGD) WITH PROPOFOL N/A 09/13/2018   Procedure: ESOPHAGOGASTRODUODENOSCOPY (EGD) WITH PROPOFOL;  Surgeon: Lin Landsman, MD;  Location: [facility] ENDOSCOPY;  Service: Gastroenterology;  Laterality: N/A;  . HERNIA REPAIR Right 1990   right inguinal hernia, done in [place]  . I&D EXTREMITY Left 10/26/2013   Procedure: IRRIGATION AND DEBRIDEMENT EXTREMITY AND REPAIR AS NECESSARY;  Surgeon: Roseanne Kaufman, MD;  Location: [facility];  Service: Orthopedics;  Laterality: Left;  . INGUINAL HERNIA REPAIR  10/23/2012  Procedure: LAPAROSCOPIC INGUINAL HERNIA;  Surgeon: Ralene Ok, MD;  Location: [facility];  Service: General;  Laterality: Left;  . INGUINAL HERNIA REPAIR Right 11/09/2018   Procedure: LAPAROSCOPIC INGUINAL HERNIA REPAIR WITH MESH;  Surgeon: Vickie Epley, MD;  Location: [facility];  Service: General;  Laterality: Right;  . INSERTION OF MESH  10/23/2012    Procedure: INSERTION OF MESH;  Surgeon: Ralene Ok, MD;  Location: [facility];  Service: General;  Laterality: Left;  . NERVE, TENDON AND ARTERY REPAIR Left 10/29/2013   Procedure: IRRIGATION AND DEBRIDMENT LEFT HAND AND BURYING RADIAL DIGITAL NERVE TO LEFT THUMB;  Surgeon: Roseanne Kaufman, MD;  Location: [facility];  Service: Orthopedics;  Laterality: Left;  . UMBILICAL HERNIA REPAIR N/A 11/09/2018   Procedure: POSSIBLE OPEN REPAIR UMBILICAL HERNIA INTERPRETER APPOINTMENT;  Surgeon: Vickie Epley, MD;  Location: [facility];  Service: General;  Laterality: N/A;    Home Medications:  Allergies as of 11/26/2018   No Known Allergies     Medication List       Accurate as of November 26, 2018  8:41 AM. Always use your most recent med list.        finasteride 5 MG tablet Commonly known as:  PROSCAR Take 5 mg by mouth at bedtime.   oxyCODONE-acetaminophen 5-325 MG tablet Commonly known as:  PERCOCET/ROXICET Take 1 tablet by mouth every 4 (four) hours as needed for severe pain.   tamsulosin 0.4 MG Caps capsule Commonly known as:  FLOMAX Take 0.4 mg by mouth at bedtime.       Allergies: No Known Allergies  Family History: Family History  Problem Relation Age of Onset  . Heart disease Other        No family history   . Diabetes Mother     Social History:  reports that he quit smoking about 26 years ago. He has a 32.00 pack-year smoking history. He has never used smokeless tobacco. He reports that he does not drink alcohol or use drugs.  ROS:                                        Physical Exam: There were no vitals taken for this visit.  Constitutional:  Alert and oriented, No acute distress.  Laboratory Data: Lab Results  Component Value Date   WBC 6.3 11/06/2018   HGB 13.9 11/06/2018   HCT 42.5 11/06/2018   MCV 80.5 11/06/2018   PLT 159 11/06/2018    Lab Results  Component Value Date   CREATININE 0.70 11/06/2018    No results found for:  PSA  No results found for: TESTOSTERONE  Lab Results  Component Value Date   HGBA1C 5.5 10/05/2018    Urinalysis    Component Value Date/Time   COLORURINE YELLOW 02/07/2016 1424   APPEARANCEUR Clear 09/27/2018 1613   LABSPEC 1.016 02/07/2016 1424   PHURINE 5.5 02/07/2016 1424   GLUCOSEU Negative 09/27/2018 1613   HGBUR MODERATE (A) 02/07/2016 1424   BILIRUBINUR negative 10/15/2018 1706   BILIRUBINUR Negative 09/27/2018 1613   KETONESUR negative 10/15/2018 1706   KETONESUR NEGATIVE 02/07/2016 1424   PROTEINUR Negative 09/27/2018 1613   PROTEINUR NEGATIVE 02/07/2016 1424   UROBILINOGEN 0.2 10/15/2018 1706   NITRITE Negative 10/15/2018 1706   NITRITE Negative 09/27/2018 1613   NITRITE NEGATIVE 02/07/2016 1424   LEUKOCYTESUR Negative 10/15/2018 1706   LEUKOCYTESUR Negative 09/27/2018 1613  Pertinent Imaging:   Assessment & Plan: The patient has chronic pain syndrome and frequency.  He has erection issues.  It will be likely difficult to improve his symptom complex.  He said he was admitted to the hospital 10 days after the hydrodistention for bleeding.  It is difficult to say if he has interstitial cystitis or not.  I think we need to have reasonable treatment goals and this was explained to him.  He was called in oxybutynin ER 10 mg with 30 tablets 11 refills.  He will follow-up with [name].  Other antimuscarinics can be tried and we should try at least 3.  An anti-inflammatory such as Mobic may be helpful chronically.  He may be a candidate for referral to Dr Lawrence Santiago for interstitial cystitis.  I think he would be at high risk of not responding very well to a treatment such as InterStim.  I would not recommend Botox.  Percutaneous tibial nerve stimulation would be an easy option  It is always difficult to take over care in a complex patient who is been extensively evaluated twice.  He did have microscopic hematuria today and it appears he has been worked up before for possible  carcinoma.  1. OAB (overactive bladder)  - Urinalysis, Complete   No follow-ups on file.  Reece Packer, MD  [place] [place] [address] [place], [place] [address] [phone]

## 2018-11-30 ENCOUNTER — Ambulatory Visit: Payer: Self-pay

## 2018-12-24 NOTE — Progress Notes (Signed)
12/25/2018  10:27 AM   [name] Draft 06-Apr-1956 [id number]  Referring provider: Antony Blackbird, MD [place], [place] [address]  Chief Complaint  Patient presents with  . Over Active Bladder    HPI: [name] is a 63 y.o. Hispanic or Latino male that presents today for a follow up with interpreter, [name].  He was originally referred by Dr. Antony Blackbird for urinary frequency. At his initial visit, he reported that his most bothersome system was frequency and occasional dysuria. He reported lower abdominal pain, urgency, nocturia, intermittency, hesitancy, straining to urinate and a weak urinary stream. He has had these symptoms for two years and seen 2 prior urologists for his symptoms. Denies gross, hematuria, flank pain, fevers, chills, nausea and vomiting. His UA at that time was positive for 11-30 RBCs and moderate bacteria. His PVR was 0 mL  In 2018, he was seen by urology specialty care in [facility] and diagnosed with chronic prostatitis/pelvic pain syndrome. He underwent cystoscopy with hydrodistention on January 15, 2018.  He was then evaluated in 2019 through [facility] in [place].  He was having the same complaints and was tried on Flomax, finasteride and Elmiron without relief.  He underwent MRI and CT urogram's which demonstrated benign cysts.  A TRUS showed a 30 cc prostate.  He underwent cystoscopy and was noted to have erythema at the bladder base, but biopsies and cytology were negative. He also had ED for which he was taking sildenafil.    I saw him on 08/09/2018 for balanitis, urinary frequency and microscopic hematuria. At that time I gave him a trial of Myrbetriq 25mg  and a prescription for Mycolog cream.   On his 10/25/2018 visit, he reported that after finishing a trial of Myrbetriq he noted no change in his symptoms. He reported that he is experiencing nocturia x 8 (last night x 6 for the first time) and  daytime frequency x 8-10+. He reported penile pain with urination and flank pain when he is unable to void. While sitting for extended periods of time he reports testicular pain and urgency. He noted that when he is sexually aroused he immediately experiences urgency as well. Reported foamy urination and a recent need to flatulate in order to urinate. He found his symptoms extremely inhibitory to his normal life functions.  He drinks 6-8 8-ounce bottles of water daily.  Does not drink coffee and occasionally drinks tea.  He drinks 1/2 bottle cranberry juice and 1/2 bottle of carrot juice daily as well as 1 glass of milk.  Reports burning dysuria when he eats spicy foods.  He doesn't drink alcohol.  He quit smoking in 1998.  At that point, he had never seen a nephrologist.  His PVR on 10/25/2018 was 107 mL.  While discussing options for treatment, he expressed that he has an aversion to catheterization due to previous painful experience with catheterization 8 months previously.  Although, he expressed he was willing to learn if it will be painless and helpful.  On the patient's 11/26/2018 visit with Dr. Matilde Sprang, he was still voiding every 10 minutes and having difficulty sitting for 1 hour.  He was getting a lot of testicular and perineal pressure.  He was getting up 8 x nightly and denied ankle edema.  His erection issues were continuing.  Dr. Lorra Hals suspected that there was a possibility that the patient may have internal cystitis, though noted that it was difficult to determine.  The patient  has been prescribed oxybutynin ER, 10 mg with 30 tab and 11 refills.  Today (12/25/2018), the patient's PVR is 83 mL and his IPSS score is 24/4.  He reports primarily left-sided testicular pain and frequency; he reports nocturia 10 x.  Sometimes he is unable to void at night without squatting lower than his toilet seat before he can urinate.  Patient reports that the oxybutynin has not helped.  Patient reports that  he still sometimes needs to flatulate in order to urinate.  Denies diarrhea and constipation.  Reports he drinks 0.5 L water a day.  Denies coffee, sodas, tea, juice.  He reports that he only drinks water.  Patient reports that he eats fruits, vegetables, fish, occasional chicken, no other meat.  Patient admits to eating spinach, carrots, celery, and onions.  Patients denies eating peppers.  Patient reports that he has had some improvement after exercise.  He reports that the tip of his penis turns purple sometimes when he has trouble urinating, but denies pain.  His PVR is 83 mL.    IPSS    Row Name 12/25/18 0900         International Prostate Symptom Score   How often have you had the sensation of not emptying your bladder?  About half the time     How often have you had to urinate less than every two hours?  Almost always     How often have you found you stopped and started again several times when you urinated?  About half the time     How often have you found it difficult to postpone urination?  Not at All     How often have you had a weak urinary stream?  About half the time     How often have you had to strain to start urination?  Almost always     How many times did you typically get up at night to urinate?  5 Times     Total IPSS Score  24       Quality of Life due to urinary symptoms   If you were to spend the rest of your life with your urinary condition just the way it is now how would you feel about that?  Mostly Disatisfied        Score:  1-7 Mild 8-19 Moderate 20-35 Severe  Balanitis Patient reports on 12/25/2018 that the patch remains dark.  Possible Peyronie's Patient reported an episode of painful intercourse 6-8 months prior to his 10/25/2018 visit. He reported curvature to the right with erection and occasionally right sided swelling.  He admitted these symptoms have persisted for the past year.  Today (12/25/2018), he admits that he is still experiencing penile  pain on erection.  He admits to occasionally having a curved erection.  He reports that he does not have pain when not erect.  He reports that a year ago he feels something busted in his penis, but he denies being erect when he felt the pain.  He felt something pop.  He does not remember if his penis swelled at that time.  He stated it occurred while at work.    Erectile Dysfunction Patient has used sildenafil in the past to treat these symptoms.  On 10/25/2018, he reported that he has had ED for the past 2 years. He reported decreased sexual activity/desire and difficulty getting and maintaining an erection. He reported urgency on sexual arousal. He experiences rare spontaneous nighttime erections.  He reports today (12/25/2018) that he feels nervous with a partner.    PMH: Past Medical History:  Diagnosis Date  . 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 Anxiety   . Depression   . ED (erectile dysfunction)   . Hyperlipidemia   . Hypertension    denies  . Thyroid disease   . Umbilical hernia without obstruction or gangrene 08/17/2018  . Unilateral recurrent inguinal hernia without obstruction or gangrene 08/17/2018  . Urinary incontinence    Surgical History: Past Surgical History:  Procedure Laterality Date  . COLONOSCOPY WITH PROPOFOL N/A 09/13/2018   Procedure: COLONOSCOPY WITH PROPOFOL;  Surgeon: Lin Landsman, MD;  Location: [facility] ENDOSCOPY;  Service: Gastroenterology;  Laterality: N/A;  . ESOPHAGOGASTRODUODENOSCOPY (EGD) WITH PROPOFOL N/A 09/13/2018   Procedure: ESOPHAGOGASTRODUODENOSCOPY (EGD) WITH PROPOFOL;  Surgeon: Lin Landsman, MD;  Location: [facility] ENDOSCOPY;  Service: Gastroenterology;  Laterality: N/A;  . HERNIA REPAIR Right 1990   right inguinal hernia, done in [place]  . I&D EXTREMITY Left 10/26/2013   Procedure: IRRIGATION AND DEBRIDEMENT EXTREMITY AND REPAIR AS NECESSARY;  Surgeon: Roseanne Kaufman, MD;  Location: [facility];  Service: Orthopedics;  Laterality: Left;  .  INGUINAL HERNIA REPAIR  10/23/2012   Procedure: LAPAROSCOPIC INGUINAL HERNIA;  Surgeon: Ralene Ok, MD;  Location: [place] [place];  Service: General;  Laterality: Left;  . INGUINAL HERNIA REPAIR Right 11/09/2018   Procedure: LAPAROSCOPIC INGUINAL HERNIA REPAIR WITH MESH;  Surgeon: Vickie Epley, MD;  Location: [facility];  Service: General;  Laterality: Right;  . INSERTION OF MESH  10/23/2012   Procedure: INSERTION OF MESH;  Surgeon: Ralene Ok, MD;  Location: [facility];  Service: General;  Laterality: Left;  . NERVE, TENDON AND ARTERY REPAIR Left 10/29/2013   Procedure: IRRIGATION AND DEBRIDMENT LEFT HAND AND BURYING RADIAL DIGITAL NERVE TO LEFT THUMB;  Surgeon: Roseanne Kaufman, MD;  Location: [facility];  Service: Orthopedics;  Laterality: Left;  . UMBILICAL HERNIA REPAIR N/A 11/09/2018   Procedure: POSSIBLE OPEN REPAIR UMBILICAL HERNIA INTERPRETER APPOINTMENT;  Surgeon: Vickie Epley, MD;  Location: [facility] ORS;  Service: General;  Laterality: N/A;    Home Medications:  Allergies as of 12/25/2018   No Known Allergies     Medication List       Accurate as of December 25, 2018 10:27 AM. Always use your most recent med list.        finasteride 5 MG tablet Commonly known as:  PROSCAR Take 5 mg by mouth at bedtime.   mirabegron ER 25 MG Tb24 tablet Commonly known as:  MYRBETRIQ Take 1 tablet (25 mg total) by mouth daily.   oxybutynin 10 MG 24 hr tablet Commonly known as:  DITROPAN-XL Take 1 tablet (10 mg total) by mouth daily.   tamsulosin 0.4 MG Caps capsule Commonly known as:  FLOMAX Take 0.4 mg by mouth at bedtime.       Allergies: No Known Allergies  Family History: Family History  Problem Relation Age of Onset  . Heart disease Other        No family history   . Diabetes Mother     Social History:  reports that he quit smoking about 26 years ago. He has a 32.00 pack-year smoking history. He has never used smokeless tobacco. He reports that he does not drink alcohol or use  drugs.  ROS: UROLOGY Frequent Urination?: Yes Hard to postpone urination?: No Burning/pain with urination?: Yes Get up at night to urinate?: Yes Leakage of urine?: No Urine stream  starts and stops?: No Trouble starting stream?: Yes Do you have to strain to urinate?: Yes Blood in urine?: Yes Urinary tract infection?: No Sexually transmitted disease?: No Injury to kidneys or bladder?: No Painful intercourse?: No Weak stream?: No Erection problems?: No Penile pain?: Yes  Gastrointestinal Nausea?: No Vomiting?: No Indigestion/heartburn?: No Diarrhea?: No Constipation?: No  Constitutional Fever: No Night sweats?: No Weight loss?: No Fatigue?: No  Skin Skin rash/lesions?: No Itching?: No  Eyes Blurred vision?: No Double vision?: No  Ears/Nose/Throat Sore throat?: No Sinus problems?: No  Hematologic/Lymphatic Swollen glands?: No Easy bruising?: No  Cardiovascular Leg swelling?: No Chest pain?: No  Respiratory Cough?: No Shortness of breath?: No  Endocrine Excessive thirst?: No  Musculoskeletal Back pain?: No Joint pain?: No  Neurological Headaches?: No Dizziness?: No  Psychologic Depression?: No Anxiety?: No  Physical Exam: BP 138/83 (BP Location: Left Arm, Patient Position: Sitting, Cuff Size: Normal)   Pulse 76   Ht 5\' 5"  (1.651 m)   Wt 196 lb (88.9 kg)   BMI 32.62 kg/m   Constitutional:  Well nourished. Alert and oriented, No acute distress. HEENT: [place] AT, moist mucus membranes.  Trachea midline Cardiovascular: No clubbing, cyanosis, or edema. Respiratory: Normal respiratory effort, no increased work of breathing. GU:  Patient with uncircumcised phallus.  Foreskin easily retracted.  Dark area of coloration of penile skin appears to be a normal variation.  Urethral meatus is patent.  No penile discharge. No penile lesions or rashes. Scrotum without lesions, cysts, rashes and/or edema.  Testicles are located scrotally bilaterally. No masses  are appreciated in the testicles. Left and right epididymis are normal.  No palpable Peyronie's plaques.   Skin: No rashes, bruises or suspicious lesions. Lymph: No inguinal adenopathy. Neurologic: Grossly intact, no focal deficits, moving all 4 extremities. Psychiatric: Normal mood and affect.  Laboratory Data:  PSA Trend  0.4 in 04/2018  0.1 in 06/2018  <0.1 in 07/1018  Lab Results  Component Value Date   WBC 6.3 11/06/2018   HGB 13.9 11/06/2018   HCT 42.5 11/06/2018   MCV 80.5 11/06/2018   PLT 159 11/06/2018   Lab Results  Component Value Date   CREATININE 0.70 11/06/2018   Lab Results  Component Value Date   HGBA1C 5.5 10/05/2018   Lab Results  Component Value Date   TSH 1.860 10/05/2018   Lab Results  Component Value Date   AST 20 06/14/2018   Lab Results  Component Value Date   ALT 24 06/14/2018   I have reviewed the labs.  Pertinent Imaging Results for AVISH, TORRY (MRN [id number]) as of 01/07/2019 09:17  Ref. Range 12/25/2018 09:55  Scan Result Unknown 83      Assessment & Plan:    1.  BPH with LU TS presenting with urgency and frequency - IPSS score is 24/4 - Continue conservative management, avoiding bladder irritants and timed voiding's - Most bothersome symptoms is/are urgency, frequency and nocturia - Patient reports Myrbetriq and oxybutynin on separate trials failed to alleviate his symptoms - His CT revealed no apparent cause for patient's symptoms. - Nephrology found no issues with patient's kidneys - Determined not to be a good candidate for InterStim or Botox, may consider PTNS in the future - Recommended to patient attempt a trial of Myrbetriq 25 mg daily and oxybutynin XL 10 mg daily at the same time while continuing tamsulosin and finasteride - RTC in 6 weeks for symptom recheck  2. Pelvic floor dysfunction Patient symptoms of needing  to strain to urinate alternating with episodes of a strong urinary stream with minimal  PVR's  PT referral deferred at this time due to COVID-19 restictions  3. ED May be more psychological as he is concerned that his parents having intercourse while he was in the womb is causing his ED   Return in about 6 weeks (around 02/05/2019) for IPSS and PVR.  Zara Council, PA-C   [facility] [address]  [place], [place] [address] [phone]  I, Adele Schilder, am acting as a Education administrator for [employer], PA-C.   I have reviewed the above documentation for accuracy and completeness, and I agree with the above.    Zara Council, PA-C

## 2018-12-25 ENCOUNTER — Other Ambulatory Visit: Payer: Self-pay

## 2018-12-25 ENCOUNTER — Encounter: Payer: Self-pay | Admitting: Urology

## 2018-12-25 ENCOUNTER — Ambulatory Visit (INDEPENDENT_AMBULATORY_CARE_PROVIDER_SITE_OTHER): Payer: Self-pay | Admitting: Urology

## 2018-12-25 VITALS — BP 138/83 | HR 76 | Ht 65.0 in | Wt 196.0 lb

## 2018-12-25 DIAGNOSIS — M6289 Other specified disorders of muscle: Secondary | ICD-10-CM

## 2018-12-25 DIAGNOSIS — N3281 Overactive bladder: Secondary | ICD-10-CM

## 2018-12-25 DIAGNOSIS — N401 Enlarged prostate with lower urinary tract symptoms: Secondary | ICD-10-CM

## 2018-12-25 DIAGNOSIS — N529 Male erectile dysfunction, unspecified: Secondary | ICD-10-CM

## 2018-12-25 DIAGNOSIS — N138 Other obstructive and reflux uropathy: Secondary | ICD-10-CM

## 2018-12-25 LAB — BLADDER SCAN AMB NON-IMAGING: SCAN RESULT: 83

## 2018-12-25 MED ORDER — MIRABEGRON ER 25 MG PO TB24: 25.0000 mg | ORAL_TABLET | Freq: Every day | ORAL | 11 refills | Status: AC

## 2018-12-27 ENCOUNTER — Other Ambulatory Visit: Payer: Self-pay

## 2018-12-27 ENCOUNTER — Ambulatory Visit: Payer: Self-pay | Attending: Internal Medicine | Admitting: Internal Medicine

## 2018-12-27 ENCOUNTER — Encounter: Payer: Self-pay | Admitting: Internal Medicine

## 2018-12-27 VITALS — BP 129/88 | HR 60 | Temp 97.8°F | Resp 16 | Wt 194.6 lb

## 2018-12-27 DIAGNOSIS — H538 Other visual disturbances: Secondary | ICD-10-CM

## 2018-12-27 DIAGNOSIS — N3281 Overactive bladder: Secondary | ICD-10-CM

## 2018-12-27 DIAGNOSIS — R682 Dry mouth, unspecified: Secondary | ICD-10-CM

## 2018-12-27 MED ORDER — OXYBUTYNIN CHLORIDE ER 5 MG PO TB24: 5.0000 mg | ORAL_TABLET | Freq: Every day | ORAL | 5 refills | Status: DC

## 2018-12-27 NOTE — Progress Notes (Signed)
Patient ID: [name], male    DOB: [birth year]  MRN: [id number]  CC: dry mouth   Subjective: [name] is a 63 y.o. male who presents for UC visit. His concerns today include:  Pt had recent RT inguinal hernia repair, chronic prostatitis/pelvic pain syndrome, OAB followed by Dr. Rogue Bussing of [facility]  Pt c/o dry mouth x 2 wks Also reports blurred vision x 1.5 yrs that is worse over past 2 wks.  Does not wear glasses.  Eyes hurt and burn when he tries to read something or look at his phone.  Last eye exam was 5 yrs ago.   He has been on Oxybutynin ER 10 x 1 mth through his urologist.  Can not afford the Myrbetriq  Pt requesting to be check for DM.  Also wants potassium and thyroid level check.  On review of his chart I see that he had a normal A1c in December.  Recent BMP also revealed normal blood sugar and potassium level.  TSH that was checked in December was normal.  Patient Active Problem List   Diagnosis Date Noted  . Prostatitis 09/20/2018  . Abdominal bloating   . Chronic diarrhea   . Colon cancer screening   . Hypertension 08/17/2018  . Thyroid disease 08/17/2018  . Hyperlipidemia 08/17/2018  . Constipation 08/17/2018     Current Outpatient Medications on File Prior to Visit  Medication Sig Dispense Refill  . finasteride (PROSCAR) 5 MG tablet Take 5 mg by mouth at bedtime.    . mirabegron ER (MYRBETRIQ) 25 MG TB24 tablet Take 1 tablet (25 mg total) by mouth daily. 30 tablet 11  . tamsulosin (FLOMAX) 0.4 MG CAPS capsule Take 0.4 mg by mouth at bedtime.     No current facility-administered medications on file prior to visit.     No Known Allergies  Social History   Socioeconomic History  . Marital status: Single    Spouse name: Not on file  . Number of children: 5  . Years of education: Not on file  . Highest education level: Not on file  Occupational History    Employer: [employer]    Comment: Tile  Social Needs   . Financial resource strain: Not on file  . Food insecurity:    Worry: Not on file    Inability: Not on file  . Transportation needs:    Medical: Not on file    Non-medical: Not on file  Tobacco Use  . Smoking status: Former Smoker    Packs/day: 2.00    Years: 16.00    Pack years: 32.00    Last attempt to quit: 10/10/1992    Years since quitting: 26.2  . Smokeless tobacco: Never Used  Substance and Sexual Activity  . Alcohol use: Never    Frequency: Never  . Drug use: No  . Sexual activity: Yes  Lifestyle  . Physical activity:    Days per week: Not on file    Minutes per session: Not on file  . Stress: Not on file  Relationships  . Social connections:    Talks on phone: Not on file    Gets together: Not on file    Attends religious service: Not on file    Active member of club or organization: Not on file    Attends meetings of clubs or organizations: Not on file    Relationship status: Not on file  . Intimate partner violence:    Fear of current  or ex partner: Not on file    Emotionally abused: Not on file    Physically abused: Not on file    Forced sexual activity: Not on file  Other Topics Concern  . Not on file  Social History Narrative  . Not on file    Family History  Problem Relation Age of Onset  . Heart disease Other        No family history   . Diabetes Mother     Past Surgical History:  Procedure Laterality Date  . COLONOSCOPY WITH PROPOFOL N/A 09/13/2018   Procedure: COLONOSCOPY WITH PROPOFOL;  Surgeon: Lin Landsman, MD;  Location: [facility] ENDOSCOPY;  Service: Gastroenterology;  Laterality: N/A;  . ESOPHAGOGASTRODUODENOSCOPY (EGD) WITH PROPOFOL N/A 09/13/2018   Procedure: ESOPHAGOGASTRODUODENOSCOPY (EGD) WITH PROPOFOL;  Surgeon: Lin Landsman, MD;  Location: [facility] ENDOSCOPY;  Service: Gastroenterology;  Laterality: N/A;  . HERNIA REPAIR Right 1990   right inguinal hernia, done in [place]  . I&D EXTREMITY Left 10/26/2013   Procedure:  IRRIGATION AND DEBRIDEMENT EXTREMITY AND REPAIR AS NECESSARY;  Surgeon: Roseanne Kaufman, MD;  Location: [place];  Service: Orthopedics;  Laterality: Left;  . INGUINAL HERNIA REPAIR  10/23/2012   Procedure: LAPAROSCOPIC INGUINAL HERNIA;  Surgeon: Ralene Ok, MD;  Location: [place];  Service: General;  Laterality: Left;  . INGUINAL HERNIA REPAIR Right 11/09/2018   Procedure: LAPAROSCOPIC INGUINAL HERNIA REPAIR WITH MESH;  Surgeon: Vickie Epley, MD;  Location: [facility];  Service: General;  Laterality: Right;  . INSERTION OF MESH  10/23/2012   Procedure: INSERTION OF MESH;  Surgeon: Ralene Ok, MD;  Location: [place];  Service: General;  Laterality: Left;  . NERVE, TENDON AND ARTERY REPAIR Left 10/29/2013   Procedure: IRRIGATION AND DEBRIDMENT LEFT HAND AND BURYING RADIAL DIGITAL NERVE TO LEFT THUMB;  Surgeon: Roseanne Kaufman, MD;  Location: [place];  Service: Orthopedics;  Laterality: Left;  . UMBILICAL HERNIA REPAIR N/A 11/09/2018   Procedure: POSSIBLE OPEN REPAIR UMBILICAL HERNIA INTERPRETER APPOINTMENT;  Surgeon: Vickie Epley, MD;  Location: [facility];  Service: General;  Laterality: N/A;    ROS: Review of Systems Negative except as stated above  PHYSICAL EXAM: BP 129/88   Pulse 60   Temp 97.8 F (36.6 C) (Oral)   Resp 16   Wt 194 lb 9.6 oz (88.3 kg)   SpO2 95%   BMI 32.38 kg/m   Wt Readings from Last 3 Encounters:  12/27/18 194 lb 9.6 oz (88.3 kg)  12/25/18 196 lb (88.9 kg)  11/26/18 195 lb (88.5 kg)   Physical Exam  General appearance - alert, well appearing, older Hispanic male and in no distress Mental status - normal mood, behavior, speech, dress, motor activity, and thought processes Neck - supple, no significant adenopathy Mouth -oral mucosa slightly dry Chest - clear to auscultation, no wheezes, rales or rhonchi, symmetric air entry Heart - normal rate, regular rhythm, normal S1, S2, no murmurs, rubs, clicks or gallops Neurological - cranial nerves II through XII  intact, motor and sensory grossly normal bilaterally, Romberg sign negative, normal gait and station  CMP Latest Ref Rng & Units 11/06/2018 10/05/2018 09/27/2018  Glucose 70 - 99 mg/dL 89 75 -  BUN 8 - 23 mg/dL 10 11 -  Creatinine 0.61 - 1.24 mg/dL 0.70 0.78 0.80  Sodium 135 - 145 mmol/L 138 141 -  Potassium 3.5 - 5.1 mmol/L 3.6 4.0 -  Chloride 98 - 111 mmol/L 104 103 -  CO2 22 - 32 mmol/L  27 23 -  Calcium 8.9 - 10.3 mg/dL 9.1 9.2 -  Total Protein 6.0 - 8.5 g/dL - - -  Total Bilirubin 0.0 - 1.2 mg/dL - - -  Alkaline Phos 39 - 117 IU/L - - -  AST 0 - 40 IU/L - - -  ALT 0 - 44 IU/L - - -   Lipid Panel  No results found for: CHOL, TRIG, HDL, CHOLHDL, VLDL, LDLCALC, LDLDIRECT  CBC    Component Value Date/Time   WBC 6.3 11/06/2018 1400   RBC 5.28 11/06/2018 1400   HGB 13.9 11/06/2018 1400   HCT 42.5 11/06/2018 1400   PLT 159 11/06/2018 1400   MCV 80.5 11/06/2018 1400   MCH 26.3 11/06/2018 1400   MCHC 32.7 11/06/2018 1400   RDW 16.3 (H) 11/06/2018 1400   LYMPHSABS 2.1 11/06/2018 1400   MONOABS 0.7 11/06/2018 1400   EOSABS 0.4 11/06/2018 1400   BASOSABS 0.0 11/06/2018 1400    ASSESSMENT AND PLAN: 1. Dry mouth 2. Blurred vision I suspect his symptoms are due to oxybutynin.  I recommend decreasing the dose from 10 mg to 5 mg to see if he tolerates better.  Patient is agreeable to doing this.  He will follow-up with his regular PCP if symptoms persist  3. Overactive bladder - oxybutynin (DITROPAN-XL) 5 MG 24 hr tablet; Take 1 tablet (5 mg total) by mouth at bedtime.  Dispense: 30 tablet; Refill: 5   Patient was given the opportunity to ask questions.  Patient verbalized understanding of the plan and was able to repeat key elements of the plan.  [employer] interpreter used during this encounter. #[id number], [id number]  No orders of the defined types were placed in this encounter.    Requested Prescriptions   Signed Prescriptions Disp Refills  . oxybutynin (DITROPAN-XL) 5 MG 24 hr  tablet 30 tablet 5    Sig: Take 1 tablet (5 mg total) by mouth at bedtime.    No follow-ups on file.  Karle Plumber, MD, FACP

## 2019-01-07 ENCOUNTER — Encounter (HOSPITAL_COMMUNITY): Payer: Self-pay

## 2019-01-07 ENCOUNTER — Other Ambulatory Visit: Payer: Self-pay

## 2019-01-07 ENCOUNTER — Emergency Department (HOSPITAL_COMMUNITY): Payer: Self-pay

## 2019-01-07 ENCOUNTER — Emergency Department (HOSPITAL_COMMUNITY)
Admission: EM | Admit: 2019-01-07 | Discharge: 2019-01-07 | Disposition: A | Discharge status: Home / Self Care | Payer: Self-pay | Attending: Emergency Medicine | Admitting: Emergency Medicine

## 2019-01-07 DIAGNOSIS — R109 Unspecified abdominal pain: Secondary | ICD-10-CM | POA: Insufficient documentation

## 2019-01-07 DIAGNOSIS — Z79899 Other long term (current) drug therapy: Secondary | ICD-10-CM | POA: Insufficient documentation

## 2019-01-07 DIAGNOSIS — Z87891 Personal history of nicotine dependence: Secondary | ICD-10-CM | POA: Insufficient documentation

## 2019-01-07 DIAGNOSIS — R519 Headache, unspecified: Secondary | ICD-10-CM

## 2019-01-07 DIAGNOSIS — I10 Essential (primary) hypertension: Secondary | ICD-10-CM | POA: Insufficient documentation

## 2019-01-07 DIAGNOSIS — E079 Disorder of thyroid, unspecified: Secondary | ICD-10-CM | POA: Insufficient documentation

## 2019-01-07 DIAGNOSIS — N3281 Overactive bladder: Secondary | ICD-10-CM | POA: Insufficient documentation

## 2019-01-07 DIAGNOSIS — R51 Headache: Secondary | ICD-10-CM | POA: Insufficient documentation

## 2019-01-07 DIAGNOSIS — R42 Dizziness and giddiness: Secondary | ICD-10-CM | POA: Insufficient documentation

## 2019-01-07 LAB — CBC WITH DIFFERENTIAL/PLATELET
Abs Immature Granulocytes: 0.02 10*3/uL (ref 0.00–0.07)
Basophils Absolute: 0 10*3/uL (ref 0.0–0.1)
Basophils Relative: 0 %
Eosinophils Absolute: 0.4 10*3/uL (ref 0.0–0.5)
Eosinophils Relative: 6 %
HCT: 42.7 % (ref 39.0–52.0)
Hemoglobin: 13.9 g/dL (ref 13.0–17.0)
IMMATURE GRANULOCYTES: 0 %
Lymphocytes Relative: 30 %
Lymphs Abs: 2.3 10*3/uL (ref 0.7–4.0)
MCH: 26.8 pg (ref 26.0–34.0)
MCHC: 32.6 g/dL (ref 30.0–36.0)
MCV: 82.4 fL (ref 80.0–100.0)
MONOS PCT: 11 %
Monocytes Absolute: 0.8 10*3/uL (ref 0.1–1.0)
NEUTROS PCT: 53 %
Neutro Abs: 4.1 10*3/uL (ref 1.7–7.7)
Platelets: 159 10*3/uL (ref 150–400)
RBC: 5.18 MIL/uL (ref 4.22–5.81)
RDW: 14.6 % (ref 11.5–15.5)
WBC: 7.7 10*3/uL (ref 4.0–10.5)
nRBC: 0 % (ref 0.0–0.2)

## 2019-01-07 LAB — URINALYSIS, ROUTINE W REFLEX MICROSCOPIC
BILIRUBIN URINE: NEGATIVE
Bacteria, UA: NONE SEEN
Glucose, UA: NEGATIVE mg/dL
Ketones, ur: NEGATIVE mg/dL
LEUKOCYTE UA: NEGATIVE
NITRITE: NEGATIVE
Protein, ur: NEGATIVE mg/dL
Specific Gravity, Urine: 1.001 — ABNORMAL LOW (ref 1.005–1.030)
pH: 7 (ref 5.0–8.0)

## 2019-01-07 LAB — COMPREHENSIVE METABOLIC PANEL
ALT: 25 U/L (ref 0–44)
AST: 24 U/L (ref 15–41)
Albumin: 3.9 g/dL (ref 3.5–5.0)
Alkaline Phosphatase: 75 U/L (ref 38–126)
Anion gap: 9 (ref 5–15)
BUN: 6 mg/dL — ABNORMAL LOW (ref 8–23)
CO2: 23 mmol/L (ref 22–32)
Calcium: 9.1 mg/dL (ref 8.9–10.3)
Chloride: 105 mmol/L (ref 98–111)
Creatinine, Ser: 0.67 mg/dL (ref 0.61–1.24)
GFR calc Af Amer: >60 mL/min (ref >60)
Glucose, Bld: 112 mg/dL — ABNORMAL HIGH (ref 70–99)
Potassium: 3.4 mmol/L — ABNORMAL LOW (ref 3.5–5.1)
Sodium: 137 mmol/L (ref 135–145)
Total Bilirubin: 0.5 mg/dL (ref 0.3–1.2)
Total Protein: 6.6 g/dL (ref 6.5–8.1)

## 2019-01-07 LAB — TROPONIN I: Troponin I: <0.03 ng/mL (ref <0.03)

## 2019-01-07 MED ORDER — METHOCARBAMOL 1000 MG/10ML IJ SOLN: 1000.0000 mg | Freq: Once | INTRAMUSCULAR | Status: DC

## 2019-01-07 MED ORDER — SODIUM CHLORIDE 0.9 % IV BOLUS
500.0000 mL | Freq: Once | INTRAVENOUS | Status: AC
  Administered 2019-01-07: 500 mL via INTRAVENOUS

## 2019-01-07 MED ORDER — METHOCARBAMOL 1000 MG/10ML IJ SOLN
1000.0000 mg | Freq: Once | INTRAVENOUS | Status: DC
  Filled 2019-01-07: qty 10

## 2019-01-07 MED ORDER — NAPROXEN 375 MG PO TABS: 375.0000 mg | ORAL_TABLET | ORAL | 0 refills | Status: AC | PRN

## 2019-01-07 MED ORDER — KETOROLAC TROMETHAMINE 15 MG/ML IJ SOLN
7.5000 mg | Freq: Once | INTRAMUSCULAR | Status: AC
  Administered 2019-01-07: 7.5 mg via INTRAVENOUS
  Filled 2019-01-07: qty 1

## 2019-01-07 NOTE — ED Provider Notes (Signed)
[place] EMERGENCY DEPARTMENT Provider Note   CSN: [id number] Arrival date & time: 01/07/19  1920    History   Chief Complaint Chief Complaint  Patient presents with   Flank Pain   Headache    HPI [name] is a 63 y.o. male presenting for evaluation of right flank pain and headache.  Patient states that the past 4 days, he has been having R low back/flank pain.  Additionally, he reports a headache which began yesterday, is present only when he is looking down.  He had 2 episodes of dizziness yesterday, which she describes as the room spinning.  They lasted for a few minutes before resolving without intervention.  He has not had any symptoms since.  He also reports intermittent chest pain for the past several days, but none today.  Patient reports 3 years of urinary frequency, and 5 months of dry mouth.  He denies recent fevers, chills, nasal congestion, speech difficulties, neck pain, ear pain, sore throat, cough, shortness of breath, nausea, vomiting, abdominal pain, or abnormal bowel movements.  He denies dysuria, hematuria, or abnormal odor/look.  Additional history obtained from chart review.  Patient has been seen multiple times for OAB and dry mouth in the past few months. Pt states he has an apt with his urologist on 4/22.      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   Anxiety    Depression    ED (erectile dysfunction)    Hyperlipidemia    Hypertension    denies   Thyroid disease    Umbilical hernia without obstruction or gangrene 08/17/2018   Unilateral recurrent inguinal hernia without obstruction or gangrene 08/17/2018   Urinary incontinence     Patient Active Problem List   Diagnosis Date Noted   Prostatitis 09/20/2018   Abdominal bloating    Chronic diarrhea    Colon cancer screening    Hypertension 08/17/2018   Thyroid disease 08/17/2018   Hyperlipidemia  08/17/2018   Constipation 08/17/2018    Past Surgical History:  Procedure Laterality Date   COLONOSCOPY WITH PROPOFOL N/A 09/13/2018   Procedure: COLONOSCOPY WITH PROPOFOL;  Surgeon: Lin Landsman, MD;  Location: [facility] END[facility]COPY;  Service: Gastroenterology;  Laterality: N/A;   ESOPHAGOGASTRODUODENOSCOPY (EGD) WITH PROPOFOL N/A 09/13/2018   Procedure: ESOPHAGOGASTRODUODENOSCOPY (EGD) WITH PROPOFOL;  Surgeon: Lin Landsman, MD;  Location: [facility] ENDOSCOPY;  Service: Gastroenterology;  Laterality: N/A;   HERNIA REPAIR Right 1990   right inguinal hernia, done in [place]   I&D EXTREMITY Left 10/26/2013   Procedure: IRRIGATION AND DEBRIDEMENT EXTREMITY AND REPAIR AS NECESSARY;  Surgeon: Roseanne Kaufman, MD;  Location: [place];  Service: Orthopedics;  Laterality: Left;   INGUINAL HERNIA REPAIR  10/23/2012   Procedure: LAPAROSCOPIC INGUINAL HERNIA;  Surgeon: Ralene Ok, MD;  Location: [place];  Service: General;  Laterality: Left;   INGUINAL HERNIA REPAIR Right 11/09/2018   Procedure: LAPAROSCOPIC INGUINAL HERNIA REPAIR WITH MESH;  Surgeon: Vickie Epley, MD;  Location: [facility];  Service: General;  Laterality: Right;   INSERTION OF MESH  10/23/2012   Procedure: INSERTION OF MESH;  Surgeon: Ralene Ok, MD;  Location: [place];  Service: General;  Laterality: Left;   NERVE, TENDON AND ARTERY REPAIR Left 10/29/2013   Procedure: IRRIGATION AND DEBRIDMENT LEFT HAND AND BURYING RADIAL DIGITAL NERVE TO LEFT THUMB;  Surgeon: Roseanne Kaufman, MD;  Location: [place];  Service: Orthopedics;  Laterality: Left;   UMBILICAL  HERNIA REPAIR N/A 11/09/2018   Procedure: POSSIBLE OPEN REPAIR UMBILICAL HERNIA INTERPRETER APPOINTMENT;  Surgeon: Vickie Epley, MD;  Location: [facility];  Service: General;  Laterality: N/A;        Home Medications    Prior to Admission medications   Medication Sig Start Date End Date Taking? Authorizing Provider  finasteride (PROSCAR) 5 MG tablet Take 5 mg by mouth  at bedtime.   Yes [provider]  oxybutynin (DITROPAN-XL) 5 MG 24 hr tablet Take 1 tablet (5 mg total) by mouth at bedtime. 12/27/18  Yes Ladell Pier, MD  tamsulosin (FLOMAX) 0.4 MG CAPS capsule Take 0.4 mg by mouth at bedtime.   Yes [provider]  mirabegron ER (MYRBETRIQ) 25 MG TB24 tablet Take 1 tablet (25 mg total) by mouth daily. Patient not taking: Reported on 01/07/2019 12/25/18   Zara Council A, PA-C  naproxen (NAPROSYN) 375 MG tablet Take 1 tablet (375 mg total) by mouth as needed (headache). 01/07/19   Brieonna Crutcher, PA-C    Family History Family History  Problem Relation Age of Onset   Heart disease Other        No family history    Diabetes Mother     Social History Social History   Tobacco Use   Smoking status: Former Smoker    Packs/day: 2.00    Years: 16.00    Pack years: 32.00    Last attempt to quit: 10/10/1992    Years since quitting: 26.2   Smokeless tobacco: Never Used  Substance Use Topics   Alcohol use: Never    Frequency: Never   Drug use: No     Allergies   Patient has no known allergies.   Review of Systems Review of Systems  Cardiovascular: Positive for chest pain (2 episodes in the past several days.  None currently).  Genitourinary: Positive for frequency.  Musculoskeletal: Positive for back pain (Right low flank pain).  Neurological: Positive for dizziness (2 episodes yesterday, resolved) and headaches (None currently).  All other systems reviewed and are negative.    Physical Exam Updated Vital Signs BP 135/90    Pulse 68    Temp 98.1 F (36.7 C) (Oral)    Resp 19    Ht 5\' 5"  (1.651 m)    Wt 88.3 kg    SpO2 93%    BMI 32.39 kg/m   Physical Exam Vitals signs and nursing note reviewed.  Constitutional:      General: He is not in acute distress.    Appearance: He is well-developed.     Comments: appears nontoxic  HENT:     Head: Normocephalic and atraumatic.  Eyes:     Extraocular Movements:  Extraocular movements intact.     Conjunctiva/sclera: Conjunctivae normal.     Pupils: Pupils are equal, round, and reactive to light.     Comments: EOMI and PERRLA.  No nystagmus.  Neck:     Musculoskeletal: Normal range of motion and neck supple.     Comments: Moving head easily without signs of pain.  No tenderness palpation of midline spine. Cardiovascular:     Rate and Rhythm: Normal rate and regular rhythm.     Pulses: Normal pulses.  Pulmonary:     Effort: Pulmonary effort is normal. No respiratory distress.     Breath sounds: Normal breath sounds. No wheezing.     Comments: Speaking in full sentences.  Clear lung sounds in all fields. Abdominal:     General: There  is no distension.     Palpations: Abdomen is soft. There is no mass.     Tenderness: There is no abdominal tenderness. There is no guarding or rebound.     Comments: No tenderness palpation of the abdomen.  No rigidity, guarding, distention.  Negative rebound.  Musculoskeletal: Normal range of motion.     Comments: Palpation of right low back musculature.  No pain over flank.  No pain over midline spine.  Ambulatory with out difficulty.  Skin:    General: Skin is warm and dry.     Capillary Refill: Capillary refill takes less than 2 seconds.  Neurological:     General: No focal deficit present.     Mental Status: He is alert and oriented to person, place, and time.     GCS: GCS eye subscore is 4. GCS verbal subscore is 5. GCS motor subscore is 6.     Cranial Nerves: Cranial nerves are intact.     Sensory: Sensation is intact.     Motor: Motor function is intact.     Coordination: Coordination is intact.     Gait: Gait is intact.     Comments: No obvious neurologic deficits.  CN intact.  Nose to finger intact.  Grip strength intact.  Fine movement coordination intact.      ED Treatments / Results  Labs (all labs ordered are listed, but only abnormal results are displayed) Labs Reviewed  COMPREHENSIVE  METABOLIC PANEL - Abnormal; Notable for the following components:      Result Value   Potassium 3.4 (*)    Glucose, Bld 112 (*)    BUN 6 (*)    All other components within normal limits  URINALYSIS, ROUTINE W REFLEX MICROSCOPIC - Abnormal; Notable for the following components:   Color, Urine COLORLESS (*)    Specific Gravity, Urine 1.001 (*)    Hgb urine dipstick SMALL (*)    All other components within normal limits  URINE CULTURE  CBC WITH DIFFERENTIAL/PLATELET  TROPONIN I    EKG EKG Interpretation  Date/Time:  Monday January 07 2019 21:37:22 EDT Ventricular Rate:  60 PR Interval:    QRS Duration: 113 QT Interval:  407 QTC Calculation: 407 R Axis:   40 Text Interpretation:  Sinus rhythm Prolonged PR interval Incomplete right bundle branch block Confirmed by Lennice Sites [phone]) on 01/07/2019 9:45:30 PM   Radiology Dg Chest 2 View  Result Date: 01/07/2019 CLINICAL DATA:  Right-sided chest pain and urinary frequency 3 days. EXAM: CHEST - 2 VIEW COMPARISON:  10/19/2012 FINDINGS: Lungs are adequately inflated without focal airspace consolidation or effusion. Minimal linear density left base likely atelectasis. Cardiomediastinal silhouette and remainder of the exam is unchanged. IMPRESSION: No acute findings.  Minimal linear atelectasis left base. Electronically Signed   By: Marin Olp M.D.   On: 01/07/2019 20:22   Ct Head Wo Contrast  Result Date: 01/07/2019 CLINICAL DATA:  Headache.  Flank pain. EXAM: CT HEAD WITHOUT CONTRAST TECHNIQUE: Contiguous axial images were obtained from the base of the skull through the vertex without intravenous contrast. COMPARISON:  CT HEAD Feb 22, 2011 FINDINGS: BRAIN: No intraparenchymal hemorrhage, mass effect nor midline shift. The ventricles and sulci are normal. No acute large vascular territory infarcts. No abnormal extra-axial fluid collections. Basal cisterns are patent. VASCULAR: Trace calcific atherosclerosis carotid siphons. SKULL/SOFT  TISSUES: No skull fracture. No significant soft tissue swelling. ORBITS/SINUSES: The included ocular globes and orbital contents are normal.Moderate paranasal sinus mucosal thickening. Mastoid air  cells are well aerated. OTHER: None. IMPRESSION: 1. Normal noncontrast CT HEAD for age. Electronically Signed   By: Elon Alas M.D.   On: 01/07/2019 21:11   Ct Renal Stone Study  Result Date: 01/07/2019 CLINICAL DATA:  Headache and flank pain with urinary has not/retention. Patient voided 50 mL upon arrival. EXAM: CT ABDOMEN AND PELVIS WITHOUT CONTRAST TECHNIQUE: Multidetector CT imaging of the abdomen and pelvis was performed following the standard protocol without IV contrast. COMPARISON:  None. FINDINGS: Lower chest: Lung bases are normal. Hepatobiliary: There is a 1.6 cm hypodensity over the right lobe of the liver compatible with a cyst. Subcentimeter hypodensity over the medial segment left lobe of the liver likely a cyst. Gallbladder and biliary tree are normal. Pancreas: Normal. Spleen: Normal. Adrenals/Urinary Tract: Adrenal glands are normal. Kidneys are normal in size without hydronephrosis or nephrolithiasis. Ureters and bladder are normal. Stomach/Bowel: Stomach and small bowel are normal. The appendix is normal. Colon is normal. Vascular/Lymphatic: Normal. Reproductive: Normal. Other: Evidence of previous left inguinal hernia repair. Mild opacification over the right inguinal region compatible with right inguinal hernia repair 11/09/2018. These postsurgical changes abut the anterior right-sided bladder wall as the bladder is otherwise within normal. Musculoskeletal: Mild degenerative change of the spine and hips. IMPRESSION: No acute findings in the abdomen/pelvis. Postsurgical change over the right inguinal region compatible with recent right inguinal hernia repair. Also evidence of previous left inguinal hernia repair. Two small liver hypodensities likely cysts. Electronically Signed   By: Marin Olp M.D.   On: 01/07/2019 21:15    Procedures Procedures (including critical care time)  Medications Ordered in ED Medications  sodium chloride 0.9 % bolus 500 mL (0 mLs Intravenous Stopped 01/07/19 2132)  ketorolac (TORADOL) 15 MG/ML injection 7.5 mg (7.5 mg Intravenous Given 01/07/19 2111)     Initial Impression / Assessment and Plan / ED Course  I have reviewed the triage vital signs and the nursing notes.  Pertinent labs & imaging results that were available during my care of the patient were reviewed by me and considered in my medical decision making (see chart for details).        Patient presenting for evaluation of multiple complaints.  Patient reports new right low back pain, headache, and dizziness.  Back pain is associated with urinary frequency, although this has been present for several years.  He is currently being treated for overactive bladder.  No hematuria or dysuria, as such low suspicion for UTI.  Consider kidney stone.  As such, will obtain urine and CT renal.  Patient also reporting several episodes of chest pain.  No chest pain currently, no associated shortness of breath, nausea, or vomiting.  Low suspicion for ACS.  However, considering age, will obtain troponin, ekg, and cxr.  Patient's headache is present only when he looks down.  As such, consider muscular cause.  However, he also reports dizziness yesterday.  No dizziness currently, and no neuro deficits.  However, will obtain labs and CT head for further evaluation.  Labs reassuring, troponin negative.  Electrolytes stable.  hemoglobin stable.  Urine without infection, does show small blood.  No leukocytosis.  Chest x-ray viewed interpreted by me, no pneumonia, pneumothorax, effusion, cardiomegaly.  EKG without STEMI.  CT is pending.  CT head and renal negative for acute findings.  Patient reports his head still hurts when he looks down.  After 7.5 the Toradol, symptoms completely resolved.  Instructed patient  to follow-up with his urologist regarding his urinary  frequency.  Encourage patient to stay well-hydrated.  Naproxen as needed for headache.  Encourage patient to follow-up with his PCP as needed for further evaluation.  At this time, patient appears safe for discharge.  Return precautions given.  Patient states he understands and agrees plan.   Final Clinical Impressions(s) / ED Diagnoses   Final diagnoses:  Acute nonintractable headache, unspecified headache type  Overactive bladder  Dizziness  Right flank pain    ED Discharge Orders         Ordered    naproxen (NAPROSYN) 375 MG tablet  As needed     01/07/19 2138           Franchot Heidelberg, PA-C 01/07/19 2305    Gareth Morgan, MD 01/08/19 [phone]

## 2019-01-07 NOTE — ED Triage Notes (Signed)
Pt arrived POV c/o H/A, Flank pain and urinary hesitance/retention.  Pt limited English speaking.  Pt able to void about 50cc upon arrival.

## 2019-01-07 NOTE — Discharge Instructions (Addendum)
Take naproxen as needed for headache.  Take only as needed. If you are also having pain in your neck, you may use muscle cream such as salon poss, icy hot, BenGay, Biofreeze You may try a humidifier at night to help with dry mouth Keep your appointment with your kidney doctor. Return to the emergency room with any new, worsening, or concerning symptoms.  Toma naproxen si tiene dolor de cabeza Si el dolor tambien esta en el cuello, puede usar creams musculos (biofreeze, bengay, salonpas, icy hot). Puede usar Production designer, theatre/television/film de humidificaion (a humidifior) por la noche para ayudar con la boca seca Va a la cita con el doctor de rinon. Regresa a la sala de emergencia si tiene nuevas sintomas.

## 2019-01-09 LAB — URINE CULTURE: Culture: NO GROWTH

## 2019-01-21 ENCOUNTER — Telehealth: Payer: Self-pay

## 2019-01-21 DIAGNOSIS — N3281 Overactive bladder: Secondary | ICD-10-CM

## 2019-01-21 MED ORDER — OXYBUTYNIN CHLORIDE ER 5 MG PO TB24: 5.0000 mg | ORAL_TABLET | Freq: Every day | ORAL | 11 refills | Status: AC

## 2019-01-21 NOTE — Telephone Encounter (Signed)
Refill for Oxybutynin sent to [facility]

## 2019-02-03 NOTE — Progress Notes (Signed)
12/25/2018  1:11 PM   [name] [facility] 1956/05/01 [id number]  Referring provider: Antony Blackbird, MD [facility] [place], [place] [address]  Chief Complaint  Patient presents with  . Urinary Frequency  . Urinary Urgency  . Penis Pain    HPI: [name] is a 63 y.o. Hispanic or Latino male that presents today for a follow up with interpreter, Chip Boer.    He was originally referred by Dr. Antony Blackbird for urinary frequency. At his initial visit, he reported that his most bothersome system was frequency and occasional dysuria. He reported lower abdominal pain, urgency, nocturia, intermittency, hesitancy, straining to urinate and a weak urinary stream. He has had these symptoms for two years and seen 2 prior urologists for his symptoms. Denies gross, hematuria, flank pain, fevers, chills, nausea and vomiting. His UA at that time was positive for 11-30 RBCs and moderate bacteria. His PVR was 0 mL  In 2018, he was seen by urology specialty care in [facility] and diagnosed with chronic prostatitis/pelvic pain syndrome. He underwent cystoscopy with hydrodistention on January 15, 2018.  He was then evaluated in 2019 through [facility] in [place].  He was having the same complaints and was tried on Flomax, finasteride and Elmiron without relief.  He underwent MRI and CT urogram's which demonstrated benign cysts.  A TRUS showed a 30 cc prostate.  He underwent cystoscopy and was noted to have erythema at the bladder base, but biopsies and cytology were negative. He also had ED for which he was taking sildenafil.    I saw him on 08/09/2018 for balanitis, urinary frequency and microscopic hematuria. At that time I gave him a trial of Myrbetriq 25mg  and a prescription for Mycolog cream.   On his 10/25/2018 visit, he reported that after finishing a trial of Myrbetriq he noted no change in his symptoms. He reported that he is experiencing nocturia x 8 (last  night x 6 for the first time) and daytime frequency x 8-10+. He reported penile pain with urination and flank pain when he is unable to void. While sitting for extended periods of time he reports testicular pain and urgency. He noted that when he is sexually aroused he immediately experiences urgency as well. Reported foamy urination and a recent need to flatulate in order to urinate. He found his symptoms extremely inhibitory to his normal life functions.  He drinks 6-8 8-ounce bottles of water daily.  Does not drink coffee and occasionally drinks tea.  He drinks 1/2 bottle cranberry juice and 1/2 bottle of carrot juice daily as well as 1 glass of milk.  Reports burning dysuria when he eats spicy foods.  He doesn't drink alcohol.  He quit smoking in 1998.  At that point, he had never seen a nephrologist.  His PVR on 10/25/2018 was 107 mL.  While discussing options for treatment, he expressed that he has an aversion to catheterization due to previous painful experience with catheterization 8 months previously.  Although, he expressed he was willing to learn if it will be painless and helpful.  On the patient's 11/26/2018 visit with Dr. Matilde Sprang, he was still voiding every 10 minutes and having difficulty sitting for 1 hour.  He was getting a lot of testicular and perineal pressure.  He was getting up 8 x nightly and denied ankle edema.  His erection issues were continuing.  Dr. Lorra Hals suspected that there was a possibility that the patient may have internal cystitis, though noted  that it was difficult to determine.  The patient has been prescribed oxybutynin ER, 10 mg with 30 tab and 11 refills.  At his visit on 12/25/2018, the patient's PVR is 83 mL and his IPSS score is 24/4.  He reports primarily left-sided testicular pain and frequency; he reports nocturia 10 x.  Sometimes he is unable to void at night without squatting lower than his toilet seat before he can urinate.  Patient reports that the  oxybutynin has not helped.  Patient reports that he still sometimes needs to flatulate in order to urinate.  Denies diarrhea and constipation.  Reports he drinks 0.5 L water a day.  Denies coffee, sodas, tea, juice.  He reports that he only drinks water.  Patient reports that he eats fruits, vegetables, fish, occasional chicken, no other meat.  Patient admits to eating spinach, carrots, celery, and onions.  Patients denies eating peppers.  Patient reports that he has had some improvement after exercise.  He reports that the tip of his penis turns purple sometimes when he has trouble urinating, but denies pain.  His PVR is 83 mL.    Today, he states he did not take the Myrbertiq 25 mg with the oxybutynin XL 10 mg daily.  He is complaining of frequency, painful urination, nocturia x 10-12, hesitancy, intermittency, frequency has a BM prior to urination as he is straining so hard to urinate and he has pain through his penis when urinating as well.  He states his symptoms abate completely for a few minutes after he urinates.  He wonders if having anal sex two years ago is causing his symptoms.  Patient denies any gross hematuria, dysuria or suprapubic.  Patient denies any fevers, chills, nausea or vomiting.  I PSS 25/6.  PVR is 120 mL.    IPSS    Row Name 02/04/19 1000         International Prostate Symptom Score   How often have you had the sensation of not emptying your bladder?  Almost always     How often have you had to urinate less than every two hours?  Almost always     How often have you found you stopped and started again several times when you urinated?  Less than half the time     How often have you found it difficult to postpone urination?  About half the time     How often have you had a weak urinary stream?  About half the time     How often have you had to strain to start urination?  Less than half the time     How many times did you typically get up at night to urinate?  5 Times 12       Total IPSS Score  25       Quality of Life due to urinary symptoms   If you were to spend the rest of your life with your urinary condition just the way it is now how would you feel about that?  Terrible        Score:  1-7 Mild 8-19 Moderate 20-35 Severe  Possible Peyronie's/Penile fracture Patient reported an episode of painful intercourse 6-8 months prior to his 10/25/2018 visit. He reported curvature to the right with erection and occasionally right sided swelling.  He admitted these symptoms have persisted for the past year.  At his visit on 12/25/2018, he admits that he is still experiencing penile pain on erection.  He admits to  occasionally having a curved erection.  He reports that he does not have pain when not erect.  He reports that a year ago he feels something busted in his penis, but he denies being erect when he felt the pain.  He felt something pop.  He does not remember if his penis swelled at that time.  He stated it occurred while at work.    Erectile Dysfunction Patient has used sildenafil in the past to treat these symptoms.  On 10/25/2018, he reported that he has had ED for the past 2 years. He reported decreased sexual activity/desire and difficulty getting and maintaining an erection. He reported urgency on sexual arousal. He experiences rare spontaneous nighttime erections.    He reported on 12/25/2018 that he feels nervous with a partner.    PMH: Past Medical History:  Diagnosis Date  . 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 Anxiety   . Depression   . ED (erectile dysfunction)   . Hyperlipidemia   . Hypertension    denies  . Thyroid disease   . Umbilical hernia without obstruction or gangrene 08/17/2018  . Unilateral recurrent inguinal hernia without obstruction or gangrene 08/17/2018  . Urinary incontinence    Surgical History: Past Surgical History:  Procedure Laterality Date  . COLONOSCOPY WITH PROPOFOL N/A 09/13/2018   Procedure: COLONOSCOPY WITH  PROPOFOL;  Surgeon: Lin Landsman, MD;  Location: [facility] ENDOSCOPY;  Service: Gastroenterology;  Laterality: N/A;  . ESOPHAGOGASTRODUODENOSCOPY (EGD) WITH PROPOFOL N/A 09/13/2018   Procedure: ESOPHAGOGASTRODUODENOSCOPY (EGD) WITH PROPOFOL;  Surgeon: Lin Landsman, MD;  Location: [facility] ENDOSCOPY;  Service: Gastroenterology;  Laterality: N/A;  . HERNIA REPAIR Right 1990   right inguinal hernia, done in [place]  . I&D EXTREMITY Left 10/26/2013   Procedure: IRRIGATION AND DEBRIDEMENT EXTREMITY AND REPAIR AS NECESSARY;  Surgeon: Roseanne Kaufman, MD;  Location: [place];  Service: Orthopedics;  Laterality: Left;  . INGUINAL HERNIA REPAIR  10/23/2012   Procedure: LAPAROSCOPIC INGUINAL HERNIA;  Surgeon: Ralene Ok, MD;  Location: [place];  Service: General;  Laterality: Left;  . INGUINAL HERNIA REPAIR Right 11/09/2018   Procedure: LAPAROSCOPIC INGUINAL HERNIA REPAIR WITH MESH;  Surgeon: Vickie Epley, MD;  Location: [facility];  Service: General;  Laterality: Right;  . INSERTION OF MESH  10/23/2012   Procedure: INSERTION OF MESH;  Surgeon: Ralene Ok, MD;  Location: [place];  Service: General;  Laterality: Left;  . NERVE, TENDON AND ARTERY REPAIR Left 10/29/2013   Procedure: IRRIGATION AND DEBRIDMENT LEFT HAND AND BURYING RADIAL DIGITAL NERVE TO LEFT THUMB;  Surgeon: Roseanne Kaufman, MD;  Location: [place];  Service: Orthopedics;  Laterality: Left;  . UMBILICAL HERNIA REPAIR N/A 11/09/2018   Procedure: POSSIBLE OPEN REPAIR UMBILICAL HERNIA INTERPRETER APPOINTMENT;  Surgeon: Vickie Epley, MD;  Location: [facility];  Service: General;  Laterality: N/A;    Home Medications:  Allergies as of 02/04/2019   No Known Allergies     Medication List       Accurate as of February 04, 2019  1:11 PM. Always use your most recent med list.        finasteride 5 MG tablet Commonly known as:  PROSCAR Take 5 mg by mouth at bedtime.   mirabegron ER 25 MG Tb24 tablet Commonly known as:  MYRBETRIQ Take 1  tablet (25 mg total) by mouth daily.   naproxen 375 MG tablet Commonly known as:  NAPROSYN Take 1 tablet (375 mg total) by mouth as needed (headache).   oxybutynin  5 MG 24 hr tablet Commonly known as:  DITROPAN-XL Take 1 tablet (5 mg total) by mouth at bedtime.   tamsulosin 0.4 MG Caps capsule Commonly known as:  FLOMAX Take 0.4 mg by mouth at bedtime.       Allergies: No Known Allergies  Family History: Family History  Problem Relation Age of Onset  . Heart disease Other        No family history   . Diabetes Mother     Social History:  reports that he quit smoking about 26 years ago. He has a 32.00 pack-year smoking history. He has never used smokeless tobacco. He reports that he does not drink alcohol or use drugs.  ROS: UROLOGY Frequent Urination?: Yes Hard to postpone urination?: No Burning/pain with urination?: Yes Get up at night to urinate?: Yes Leakage of urine?: No Urine stream starts and stops?: No Trouble starting stream?: Yes Do you have to strain to urinate?: Yes Blood in urine?: No Urinary tract infection?: No Sexually transmitted disease?: No Injury to kidneys or bladder?: No Painful intercourse?: No Weak stream?: No Erection problems?: Yes Penile pain?: Yes  Gastrointestinal Nausea?: No Vomiting?: No Indigestion/heartburn?: No Diarrhea?: No Constipation?: No  Constitutional Fever: No Night sweats?: No Weight loss?: No Fatigue?: No  Skin Skin rash/lesions?: No Itching?: No  Eyes Blurred vision?: No Double vision?: No  Ears/Nose/Throat Sore throat?: No Sinus problems?: No  Hematologic/Lymphatic Swollen glands?: No Easy bruising?: No  Cardiovascular Leg swelling?: No Chest pain?: No  Respiratory Cough?: No Shortness of breath?: No  Endocrine Excessive thirst?: No  Musculoskeletal Back pain?: Yes Joint pain?: Yes  Neurological Headaches?: No Dizziness?: No  Psychologic Depression?: No Anxiety?: No   Physical Exam: BP 133/79 (BP Location: Left Arm, Patient Position: Sitting, Cuff Size: Normal)   Pulse 73   Ht 5\' 5"  (1.651 m)   Wt 191 lb 6.4 oz (86.8 kg)   BMI 31.85 kg/m   Constitutional:  Well nourished. Alert and oriented, No acute distress. HEENT: [place] AT, moist mucus membranes.  Trachea midline, no masses. Cardiovascular: No clubbing, cyanosis, or edema. Respiratory: Normal respiratory effort, no increased work of breathing. GI: Abdomen is soft, non tender, non distended, no abdominal masses.  GU: No CVA tenderness.  No bladder fullness or masses.  Patient with uncircumcised phallus.  Foreskin easily retracted.    Urethral meatus is patent.  No penile discharge. No penile lesions or rashes.  Skin: No rashes, bruises or suspicious lesions.  Skin tags noted in the gluteal folds.   Neurologic: Grossly intact, no focal deficits, moving all 4 extremities. Psychiatric: Normal mood and affect.  Laboratory Data:  PSA Trend  0.4 in 04/2018  0.1 in 06/2018  <0.1 in 07/1018  Lab Results  Component Value Date   WBC 7.7 01/07/2019   HGB 13.9 01/07/2019   HCT 42.7 01/07/2019   MCV 82.4 01/07/2019   PLT 159 01/07/2019   Lab Results  Component Value Date   CREATININE 0.67 01/07/2019   Lab Results  Component Value Date   HGBA1C 5.5 10/05/2018   Lab Results  Component Value Date   TSH 1.860 10/05/2018   Lab Results  Component Value Date   AST 24 01/07/2019   Lab Results  Component Value Date   ALT 25 01/07/2019   I have reviewed the labs.  Pertinent Imaging Results for MAT, STUARD (MRN [id number]) as of 02/04/2019 12:53  Ref. Range 02/04/2019 10:59  Scan Result Unknown 193mL   Bladder Rescue Solution  Instillation Due to bladder pain patient is present today for a Rescue Solution Treatment.  Patient was cleaned and prepped in a sterile fashion with betadine and lidocaine 2% jelly was instilled into the urethra.  A 14 FR catheter was inserted, urine return was  noted 50 ml, urine was clear in color.  Instilled a solution consisting of 81ml of Sodium Bicarb, 2 ml Lidocaine and 1 ml of Heparin. The catheter was then removed. Patient tolerated well, no complications were noted.   Performed by: Zara Council, PA-C and Gordy Clement, [place]:    1.  BPH with LU TS presenting with urgency and frequency - IPSS score is 25/6, it is worse - Continue conservative management, avoiding bladder irritants and timed voiding's - Most bothersome symptoms is/are urgency, frequency and nocturia - Patient reports Myrbetriq and oxybutynin on separate trials failed to alleviate his symptoms - has not tried medication together - His CTU, MRI, cysto, bladder bx revealed no apparent cause for patient's symptoms. - Nephrology found no issues with patient's kidneys - Determined not to be a good candidate for InterStim or Botox, may consider PTNS in the future - Given rescue solution at today's visit - Will have a tele visit tomorrow to see if the rescue solution was helpful  2. Pelvic floor dysfunction Patient symptoms of needing to strain to urinate alternating with episodes of a strong urinary stream with minimal PVR's  PT referral deferred at this time due to COVID-19 restictions  3. ED May be more psychological as he is concerned that his parents having intercourse while he was in the womb is causing his ED   Return for tomorrow with televisit .  Zara Council, PA-C   [facility] [address]  [place], [place] [address] [phone]

## 2019-02-04 ENCOUNTER — Ambulatory Visit (INDEPENDENT_AMBULATORY_CARE_PROVIDER_SITE_OTHER): Payer: Self-pay | Admitting: Urology

## 2019-02-04 ENCOUNTER — Encounter: Payer: Self-pay | Admitting: Urology

## 2019-02-04 ENCOUNTER — Other Ambulatory Visit: Payer: Self-pay

## 2019-02-04 VITALS — BP 133/79 | HR 73 | Ht 65.0 in | Wt 191.4 lb

## 2019-02-04 DIAGNOSIS — R300 Dysuria: Secondary | ICD-10-CM

## 2019-02-04 DIAGNOSIS — N138 Other obstructive and reflux uropathy: Secondary | ICD-10-CM

## 2019-02-04 DIAGNOSIS — N3281 Overactive bladder: Secondary | ICD-10-CM

## 2019-02-04 DIAGNOSIS — N401 Enlarged prostate with lower urinary tract symptoms: Secondary | ICD-10-CM

## 2019-02-04 LAB — BLADDER SCAN AMB NON-IMAGING

## 2019-02-04 MED ORDER — SODIUM BICARBONATE 8.4 % IV SOLN
11.0000 mL | Freq: Once | INTRAVENOUS | Status: AC
  Administered 2019-02-04: 11 mL

## 2019-02-04 NOTE — Addendum Note (Signed)
Addended by: Donalee Citrin on: 02/04/2019 01:33 PM   Modules accepted: Orders

## 2019-02-05 ENCOUNTER — Other Ambulatory Visit: Payer: Self-pay

## 2019-02-05 ENCOUNTER — Telehealth (INDEPENDENT_AMBULATORY_CARE_PROVIDER_SITE_OTHER): Payer: Self-pay | Admitting: Urology

## 2019-02-05 ENCOUNTER — Ambulatory Visit: Payer: Self-pay | Admitting: Urology

## 2019-02-05 DIAGNOSIS — N3010 Interstitial cystitis (chronic) without hematuria: Secondary | ICD-10-CM

## 2019-02-07 NOTE — Progress Notes (Signed)
Virtual Visit via Telephone Note  I connected with [name] on 02/05/2019 at 1330  by telephone with [place] through interpretive services and verified that I am speaking with the correct person using two identifiers.  They are located at home.  I am located at my home.    This visit type was conducted due to national recommendations for restrictions regarding the COVID-19 Pandemic (e.g. social distancing).  This format is felt to be most appropriate for this patient at this time.  All issues noted in this document were discussed and addressed.  No physical exam was performed.   I discussed the limitations, risks, security and privacy concerns of performing an evaluation and management service by telephone and the availability of in person appointments. I also discussed with the patient that there may be a patient responsible charge related to this service. The patient expressed understanding and agreed to proceed.   History of Present Illness: [name] is a 63 year old Hispanic male who received a rescue bladder solution yesterday in hopes that it would ease his symptoms of urgency, frequency and nocturia.    He has been having irritative bladder symptoms for over two years.  He has been worked up extensively in [place], [place] and [place] for these symptoms and no etiology was discovered.  He was tried on finasteride, tamsulosin, Elmiron, Myrbetriq and Ditropan without relieve of his symptoms.    Yesterday, we instilled a rescue solution of Heparin, Sodium Bicarbonate and Lidocaine into his bladder through a catheter in hopes to have some relief of his symptoms.  He states that he was able to hold the solution for the 45 minutes as instructed.  He states it made difference in his urinary symptoms whatsoever.    Patient denies any gross hematuria, dysuria or suprapubic/flank pain.  Patient denies any fevers, chills, nausea or vomiting.    Observations/Objective: [name] did not  sound distressed on the phone.  He was able to answer questions appropriately.    Assessment and Plan:  1. Interstitial cystitis - will refer patient to Dr. Alona Bene for further evaluation and management - he will stop by the office on Monday to pick up Myrbetriq samples to try the medication with the oxybutynin to see if he can find relief with his symptoms - also consider PTNS once the pandemic restrictions have been lifted  Follow Up Instructions:  [name] will be referred to Dr. Alona Bene   I discussed the assessment and treatment plan with the patient. The patient was provided an opportunity to ask questions and all were answered. The patient agreed with the plan and demonstrated an understanding of the instructions.   The patient was advised to call back or seek an in-person evaluation if the symptoms worsen or if the condition fails to improve as anticipated.  I provided 10 minutes of non-face-to-face time during this encounter.   Latash Nouri, PA-C

## 2019-02-13 ENCOUNTER — Ambulatory Visit: Payer: Self-pay | Attending: Family Medicine | Admitting: Physician Assistant

## 2019-02-13 ENCOUNTER — Other Ambulatory Visit: Payer: Self-pay

## 2019-02-13 ENCOUNTER — Encounter: Payer: Self-pay | Admitting: Physician Assistant

## 2019-02-13 DIAGNOSIS — E559 Vitamin D deficiency, unspecified: Secondary | ICD-10-CM

## 2019-02-13 DIAGNOSIS — Z789 Other specified health status: Secondary | ICD-10-CM

## 2019-02-13 DIAGNOSIS — R350 Frequency of micturition: Secondary | ICD-10-CM

## 2019-02-13 NOTE — Progress Notes (Signed)
Virtual Visit via Telephone Note  I connected with [name] on 02/13/19 at 10:50 AM EDT by telephone and verified that I am speaking with the correct person using two identifiers.   I discussed the limitations, risks, security and privacy concerns of performing an evaluation and management service by telephone and the availability of in person appointments. I also discussed with the patient that there may be a patient responsible charge related to this service. The patient expressed understanding and agreed to proceed.  Patient location:  home My Location:  [place] office Persons on the call:  Myself, interpreter, and the patient.    History of Present Illness: Patient calling with urinary frequency.  He Just had a phone visit with urology 02/05/2019.  Was supposed to pick up samples of Myrbetriq but didn't pick them up until yesterday.  No fever.  He has been having extensive work up for this problem and has tried multiple medications for more than 2 years.  He also has several questions on previous lab work he has had done.     Observations/Objective: TP linear.  Speech is clear   Assessment and Plan: 1. Urinary frequency Reassurance.  Take myrbetriq.  F/up with urology  2. Language barrier pacific interpreters used and additional time performing visit was required.  Also discussed multiple other labs and reviewed previous test results with him.    3.  Check vitamin D.    Follow Up Instructions: 6 weeks with PCP   I discussed the assessment and treatment plan with the patient. The patient was provided an opportunity to ask questions and all were answered. The patient agreed with the plan and demonstrated an understanding of the instructions.   The patient was advised to call back or seek an in-person evaluation if the symptoms worsen or if the condition fails to improve as anticipated.  I provided 22 minutes of non-face-to-face time during this encounter.   Freeman Caldron, PA-C  Patient ID: [name], male   DOB: 16-Apr-1956, 63 y.o.   MRN: [id number]

## 2019-02-13 NOTE — Progress Notes (Signed)
Patient with visit for burning upon urination.  Patient endorses this is effecting his sleep.  Patient not taking tamilosin

## 2019-03-01 IMAGING — CT CT ABD-PEL WO/W CM
3 of 12 series · 12 of 46 positions shown, 18 images · IV contrast (iopamidol)
Comparison: None.

CLINICAL DATA: Hematuria.

EXAM:
CT ABDOMEN AND PELVIS WITHOUT AND WITH CONTRAST
TECHNIQUE: Multidetector CT imaging of the abdomen and pelvis was performed
following the standard protocol before and following the bolus
administration of intravenous contrast.
CONTRAST:  125mL [id number]-BZZ IOPAMIDOL ([id number]-BZZ) INJECTION 61%

[Series 2: without pre · axial · non-contrast · 0.79mm/px · z∈[-1547,-1357]mm · 4 of 90 slices shown]
[im 13/90  soft-tissue]
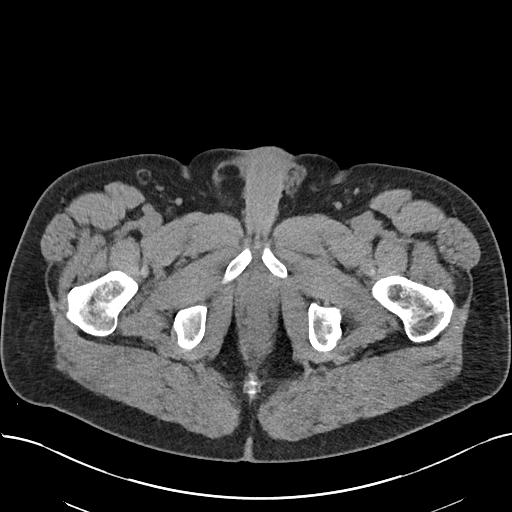
[im 26/90  soft-tissue]
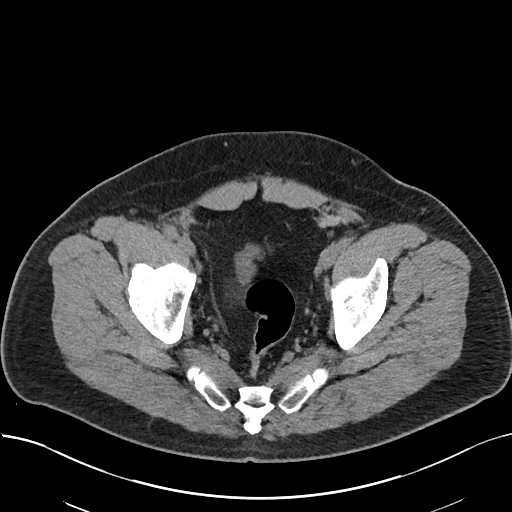
[im 39/90  soft-tissue]
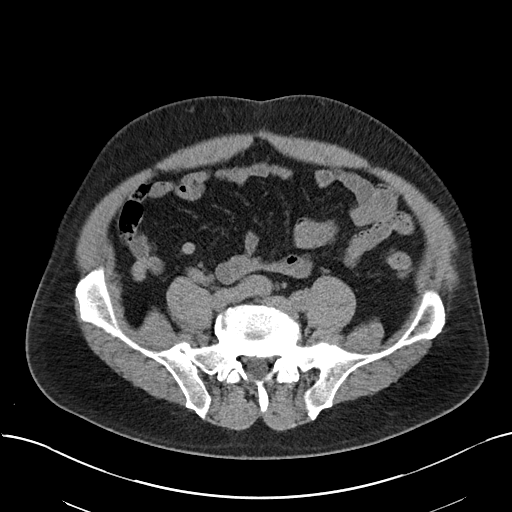
[im 51/90  soft-tissue]
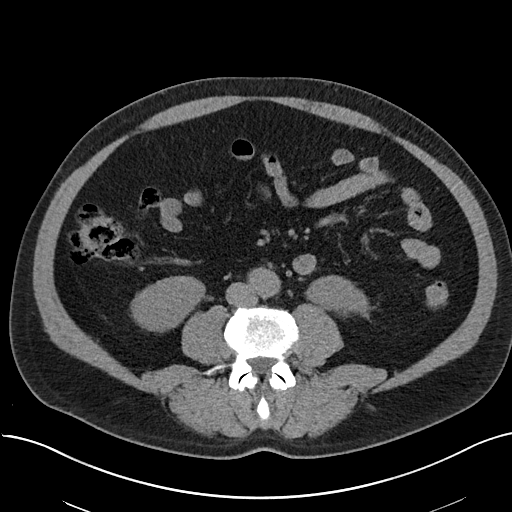

[Series 5: cor without without pre · coronal · non-contrast · 0.79mm/px · 2 of 158 slices shown, 3 images]
[im 53/158  soft-tissue]
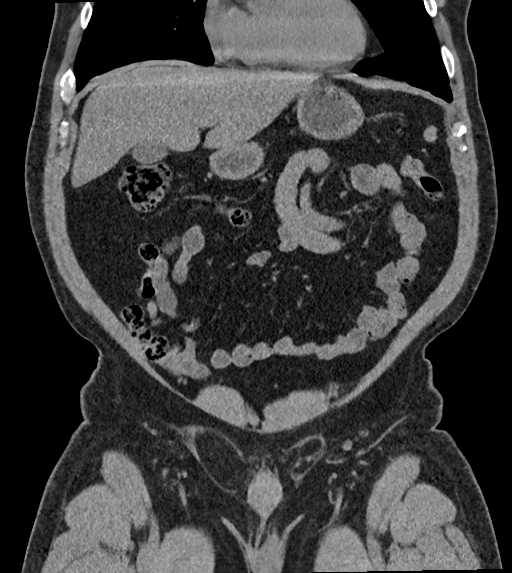
[im 53/158  bone]
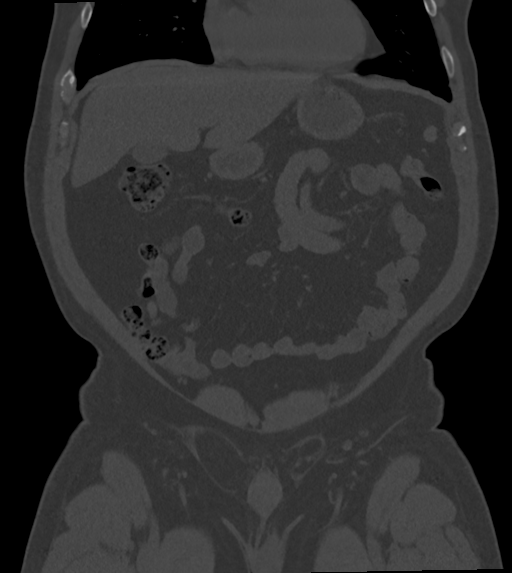
[im 105/158  soft-tissue]
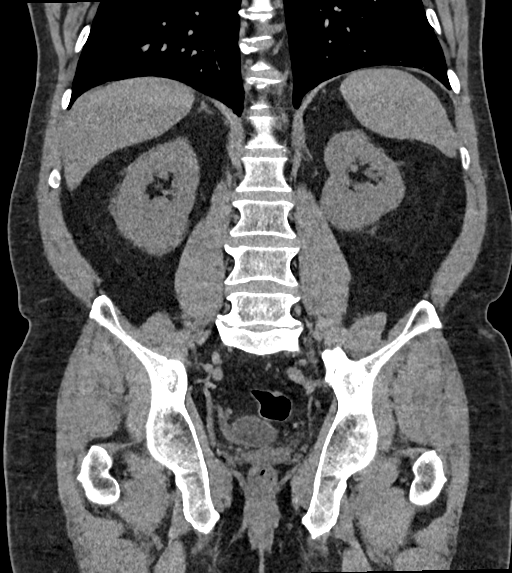

[Series 17: axial delay delay prone · axial · delayed · 0.81mm/px · z∈[-1441,-1101]mm · 6 of 96 slices shown, 11 images]
[im 14/96  soft-tissue]
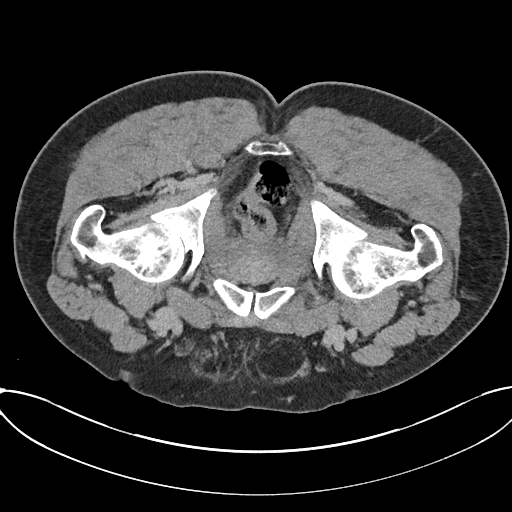
[im 14/96  bone]
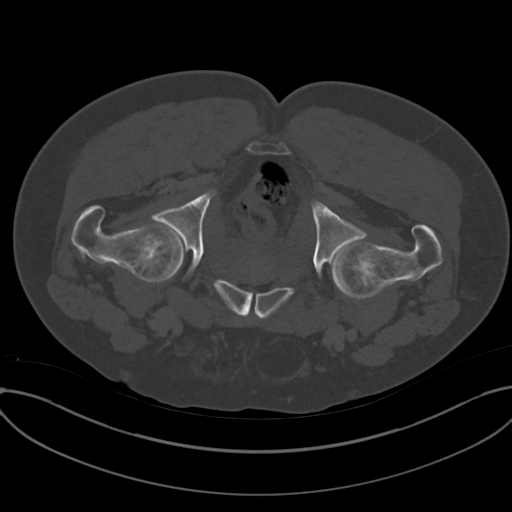
[im 28/96  soft-tissue]
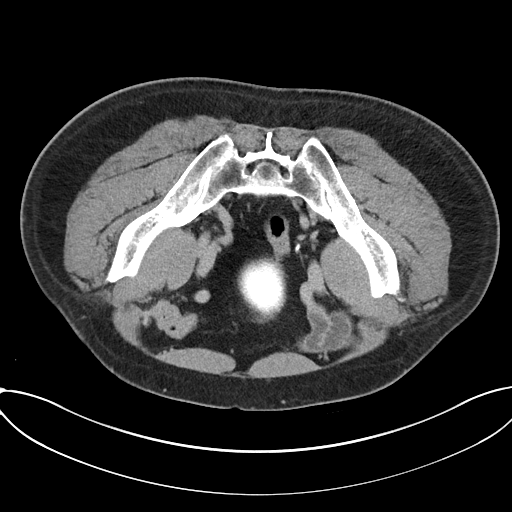
[im 41/96  soft-tissue]
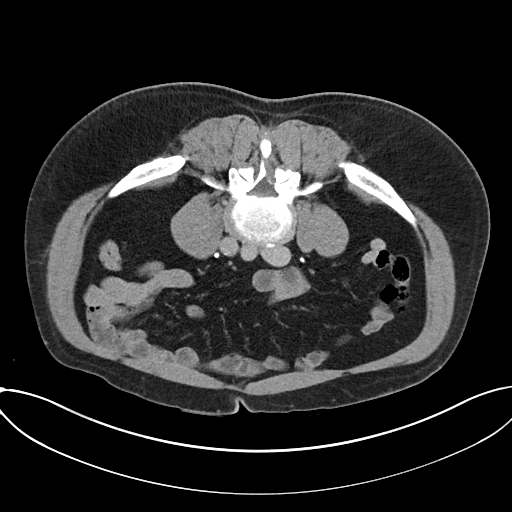
[im 41/96  lung]
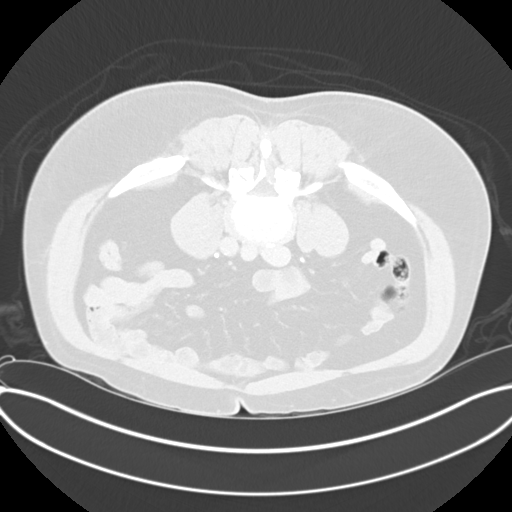
[im 55/96  soft-tissue]
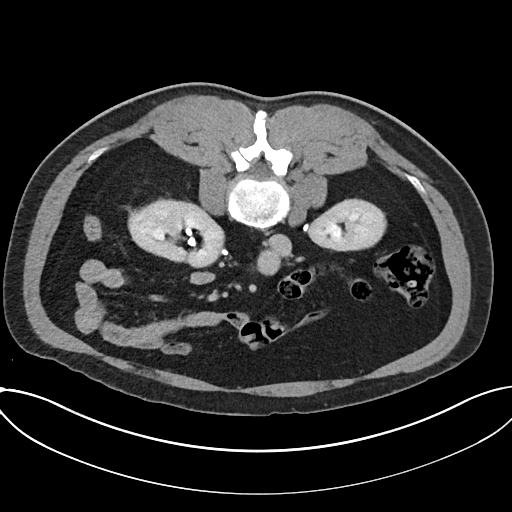
[im 55/96  lung]
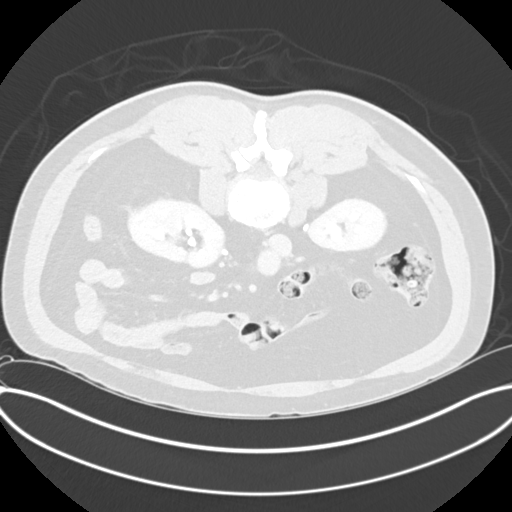
[im 68/96  soft-tissue]
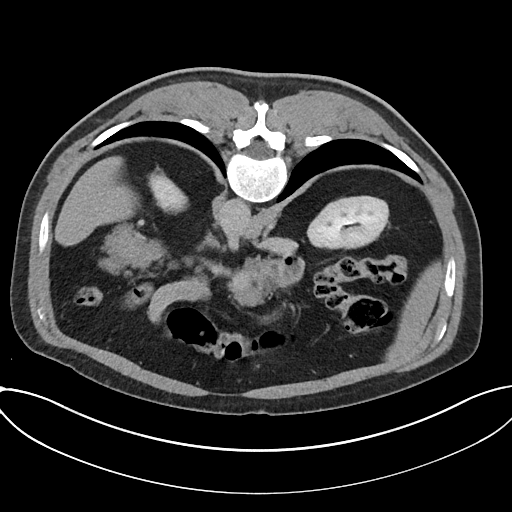
[im 68/96  lung]
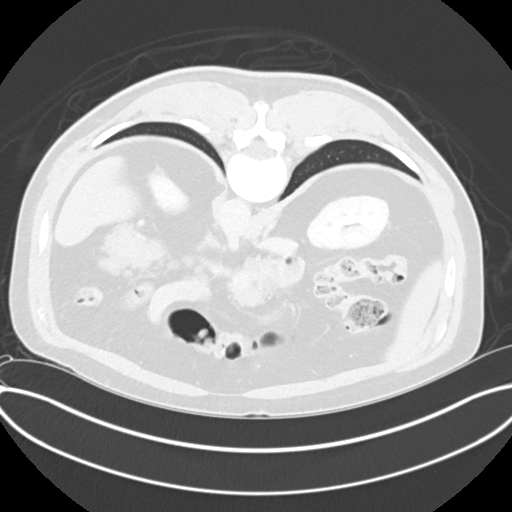
[im 82/96  soft-tissue]
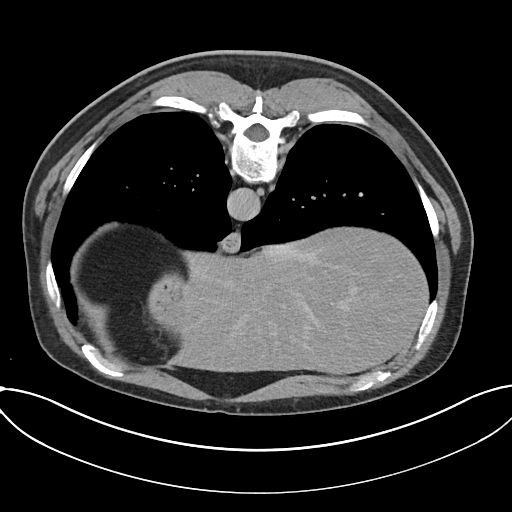
[im 82/96  lung]
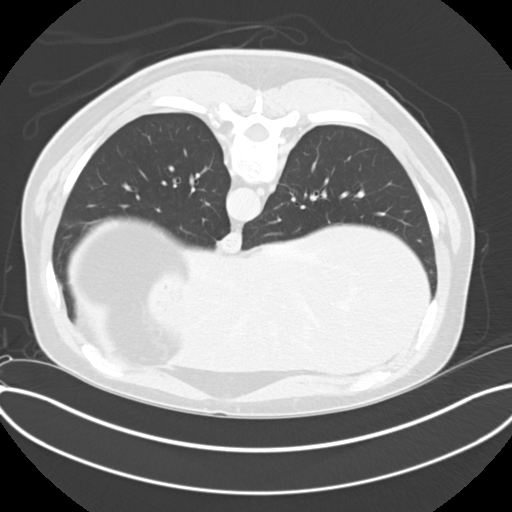

[12 of 46 positions shown; findings below may reference images not displayed]

FINDINGS: Lower chest: The lung bases are clear of acute process. No pleural
effusion or pulmonary lesions. The heart is normal in size. No
pericardial effusion. The distal esophagus and aorta are
unremarkable.

Hepatobiliary: A few small low-attenuation lesions consistent with
benign cysts. No worrisome hepatic lesions or intrahepatic biliary
dilatation. The gallbladder is normal. No common bile duct
dilatation.

Pancreas: No mass, inflammation or ductal dilatation.

Spleen: Normal size.  No focal lesions.

Adrenals/Urinary Tract: The adrenal glands are normal.

No renal, ureteral or bladder calculi. Both kidneys demonstrate
normal enhancement/perfusion. No worrisome renal lesions. The
delayed images do not demonstrate any significant collecting system
abnormalities. Both ureters are normal. The bladder is normal.

Stomach/Bowel: The stomach, duodenum, small bowel and colon are
grossly normal without oral contrast. No inflammatory changes, mass
lesions or obstructive findings. The appendix is normal.

Vascular/Lymphatic: The aorta is normal in caliber. No dissection.
The branch vessels are patent. The major venous structures are
patent. No mesenteric or retroperitoneal mass or adenopathy. Small
scattered lymph nodes are noted.

Reproductive: The prostate gland and seminal vesicles are
unremarkable.

Other: Evidence of prior surgical repair of a left inguinal hernia.
There is a right inguinal hernia containing fat.

Musculoskeletal: No significant bony findings.
IMPRESSION: 1. No CT findings to account for the patient's hematuria. No renal,
ureteral or bladder calculi or mass.
2. Benign-appearing hepatic cysts.
3. No acute abdominal/pelvic findings, mass lesions or adenopathy.
4. Remote left inguinal hernia repair and small right inguinal
hernia containing fat.

## 2019-09-09 ENCOUNTER — Telehealth: Payer: Self-pay | Admitting: Urology

## 2019-09-09 NOTE — Telephone Encounter (Signed)
[name] is due for his yearly visit for hematuria, PSA and prostate exam.  Would you please call him and have him schedule an appointment for this?

## 2019-09-12 NOTE — Telephone Encounter (Signed)
Myself and [name] (interpreter) called all phone numbers in pt's chart, no answer/ or voicemail set up.

## 2019-10-21 ENCOUNTER — Encounter: Payer: Self-pay | Admitting: Urology

## 2019-12-30 ENCOUNTER — Encounter: Payer: Self-pay | Admitting: Urology

## 2020-01-08 ENCOUNTER — Telehealth: Payer: Self-pay | Admitting: Urology

## 2020-02-10 NOTE — Telephone Encounter (Signed)
Error

## 2020-04-06 NOTE — Progress Notes (Signed)
Certified Letter
